# Patient Record
Sex: Male | Born: 1995 | Race: White | Hispanic: No | Marital: Single | Smoking: Never smoker
Health system: Southern US, Community
[De-identification: ages and names within clinical notes are randomized; demographics above are authoritative.]

## PROBLEM LIST (undated history)

## (undated) ENCOUNTER — Emergency Department (HOSPITAL_COMMUNITY): Discharge status: Absent without leave | Payer: Self-pay

## (undated) DIAGNOSIS — F209 Schizophrenia, unspecified: Secondary | ICD-10-CM

## (undated) DIAGNOSIS — F329 Major depressive disorder, single episode, unspecified: Secondary | ICD-10-CM

## (undated) DIAGNOSIS — F32A Depression, unspecified: Secondary | ICD-10-CM

## (undated) DIAGNOSIS — F849 Pervasive developmental disorder, unspecified: Secondary | ICD-10-CM

## (undated) HISTORY — PX: ANTERIOR CRUCIATE LIGAMENT REPAIR: SHX115

---

## 2015-02-19 ENCOUNTER — Emergency Department (HOSPITAL_COMMUNITY)
Admission: EM | Admit: 2015-02-19 | Discharge: 2015-02-19 | Disposition: A | Discharge status: Home / Self Care | Payer: Medicaid Other | Attending: Emergency Medicine | Admitting: Emergency Medicine

## 2015-02-19 ENCOUNTER — Encounter (HOSPITAL_COMMUNITY): Payer: Self-pay | Admitting: Emergency Medicine

## 2015-02-19 DIAGNOSIS — F209 Schizophrenia, unspecified: Secondary | ICD-10-CM | POA: Insufficient documentation

## 2015-02-19 DIAGNOSIS — F329 Major depressive disorder, single episode, unspecified: Secondary | ICD-10-CM | POA: Diagnosis not present

## 2015-02-19 DIAGNOSIS — Z760 Encounter for issue of repeat prescription: Secondary | ICD-10-CM | POA: Insufficient documentation

## 2015-02-19 HISTORY — DX: Depression, unspecified: F32.A

## 2015-02-19 HISTORY — DX: Schizophrenia, unspecified: F20.9

## 2015-02-19 HISTORY — DX: Pervasive developmental disorder, unspecified: F84.9

## 2015-02-19 HISTORY — DX: Major depressive disorder, single episode, unspecified: F32.9

## 2015-02-19 MED ORDER — FLUOXETINE HCL 20 MG PO CAPS
20.0000 mg | ORAL_CAPSULE | Freq: Every day | ORAL | Status: DC
  Administered 2015-02-19: 20 mg via ORAL
  Filled 2015-02-19: qty 1

## 2015-02-19 MED ORDER — FLUOXETINE HCL 20 MG PO TABS
20.0000 mg | ORAL_TABLET | Freq: Every day | ORAL | Status: DC
Start: 2015-02-19 — End: 2015-03-25

## 2015-02-19 NOTE — Discharge Instructions (Signed)
Medicine Refill at the Emergency Department  We have refilled your medicine today, but it is best for you to get refills through your primary health care provider's office. In the future, please plan ahead so you do not need to get refills from the emergency department.  If the medicine we refilled was a maintenance medicine, you may have received only enough to get you by until you are able to see your regular health care provider.     This information is not intended to replace advice given to you by your health care provider. Make sure you discuss any questions you have with your health care provider.     Document Released: 05/11/2003 Document Revised: 02/12/2014 Document Reviewed: 05/01/2013  Elsevier Interactive Patient Education 2016 Elsevier Inc.

## 2015-02-19 NOTE — ED Notes (Signed)
SEE PA assessment 

## 2015-02-19 NOTE — ED Notes (Signed)
Pt reports that he ran out of his Psych medication Prozac yesterday and was unable to see his psychiatrist to get a refill. Pt denies any psych complaints at this time.

## 2015-02-19 NOTE — ED Notes (Signed)
Declined W/C at D/C and was escorted to lobby by RN. 

## 2015-02-19 NOTE — ED Provider Notes (Signed)
CSN: [id number]     Arrival date & time 02/19/15  1720 History  By signing my name below, I, Emmanuella Mensah, attest that this documentation has been prepared under the direction and in the presence of Cheri FowlerKayla Jace Dowe, PA-C. Electronically Signed: Angelene GiovanniEmmanuella Mensah, ED Scribe. 02/19/2015. 5:51 PM.    Chief Complaint  Patient presents with  . Medication Refill   The history is provided by the patient. No language interpreter was used.   HPI Comments: Brett Pilgriman Meza is a 20 y.o. male who presents to the Emergency Department for his psych medication refill. He reports that he is currently on 20 mg of Prozac once a day for depression and he ran out of his medication yesterday.  He explains that he is here because he [facility], but they are closed for 3 days (reopen Tuesday).  He denies any current SI/HI, pain or any other complaints.    Past Medical History  Diagnosis Date  . Depression   . Schizophrenia, childhood    Past Surgical History  Procedure Laterality Date  . Anterior cruciate ligament repair     No family history on file. Social History  Substance Use Topics  . Smoking status: Never Smoker   . Smokeless tobacco: None  . Alcohol Use: No    Review of Systems  Musculoskeletal: Negative for myalgias.  Psychiatric/Behavioral: Negative for suicidal ideas and self-injury.  All other systems reviewed and are negative.     Allergies  Review of patient's allergies indicates no known allergies.  Home Medications   Prior to Admission medications   Medication Sig Start Date End Date Taking? Authorizing Provider  FLUoxetine (PROZAC) 20 MG tablet Take 1 tablet (20 mg total) by mouth daily. 02/19/15   Jayin Derousse, PA-C   BP 124/66 mmHg  Pulse 76  Temp(Src) 98.3 F (36.8 C) (Oral)  Resp 20  Wt 65.772 kg  SpO2 98% Physical Exam  Constitutional: He is oriented to person, place, and time. He appears well-developed and well-nourished.  HENT:   Head: Normocephalic and atraumatic.  Mouth/Throat: Oropharynx is clear and moist.  Eyes: Conjunctivae are normal.  Neck: Normal range of motion. Neck supple.  Cardiovascular: Normal rate, regular rhythm and normal heart sounds.   No murmur heard. Pulmonary/Chest: Effort normal and breath sounds normal. No accessory muscle usage or stridor. No respiratory distress. He has no wheezes. He has no rhonchi. He has no rales.  Abdominal: He exhibits no distension.  Musculoskeletal: Normal range of motion.  Lymphadenopathy:    He has no cervical adenopathy.  Neurological: He is alert and oriented to person, place, and time.  Speech clear without dysarthria.  Skin: Skin is warm and dry.  Psychiatric: He has a normal mood and affect. His behavior is normal.  Nursing note and vitals reviewed.   ED Course  Procedures (including critical care time) DIAGNOSTIC STUDIES: Oxygen Saturation is 98% on RA, normal by my interpretation.    COORDINATION OF CARE: 5:48 PM- Pt advised of plan for treatment and pt agrees. Pt will receive a dose of 20 mg Prozac here and a prescription to fill until [facility] opens.   MDM  Pt here for refill of medication. Medication is not a controlled substance. Will refill medication here. Discussed need to follow up with PCP in 2-3 days.  Pt is safe for discharge at this time.  Final diagnoses:  Medication refill    I personally performed the services described in this documentation, which was  scribed in my presence. The recorded information has been reviewed and is accurate.   Cheri Fowler, PA-C 02/19/15 1753  Gerhard Munch, MD 02/19/15 [id number]

## 2015-03-21 ENCOUNTER — Emergency Department (HOSPITAL_COMMUNITY)
Admission: EM | Admit: 2015-03-21 | Discharge: 2015-03-22 | Disposition: A | Discharge status: Other Acute Inpatient Hospital | Payer: MEDICAID | Attending: Emergency Medicine | Admitting: Emergency Medicine

## 2015-03-21 ENCOUNTER — Encounter (HOSPITAL_COMMUNITY): Payer: Self-pay | Admitting: *Deleted

## 2015-03-21 DIAGNOSIS — F332 Major depressive disorder, recurrent severe without psychotic features: Secondary | ICD-10-CM | POA: Insufficient documentation

## 2015-03-21 DIAGNOSIS — Z79899 Other long term (current) drug therapy: Secondary | ICD-10-CM | POA: Insufficient documentation

## 2015-03-21 DIAGNOSIS — F209 Schizophrenia, unspecified: Secondary | ICD-10-CM | POA: Insufficient documentation

## 2015-03-21 DIAGNOSIS — R45851 Suicidal ideations: Secondary | ICD-10-CM

## 2015-03-21 LAB — URINALYSIS, ROUTINE W REFLEX MICROSCOPIC
Bilirubin Urine: NEGATIVE
Glucose, UA: NEGATIVE mg/dL
HGB URINE DIPSTICK: NEGATIVE
Ketones, ur: NEGATIVE mg/dL
Leukocytes, UA: NEGATIVE
Nitrite: NEGATIVE
PROTEIN: NEGATIVE mg/dL
Specific Gravity, Urine: 1.024 (ref 1.005–1.030)
pH: 5.5 (ref 5.0–8.0)

## 2015-03-21 LAB — COMPREHENSIVE METABOLIC PANEL
ALBUMIN: 4.9 g/dL (ref 3.5–5.0)
ALT: 26 U/L (ref 17–63)
AST: 23 U/L (ref 15–41)
Alkaline Phosphatase: 60 U/L (ref 38–126)
Anion gap: 7 (ref 5–15)
BILIRUBIN TOTAL: 0.5 mg/dL (ref 0.3–1.2)
BUN: 15 mg/dL (ref 6–20)
CHLORIDE: 106 mmol/L (ref 101–111)
CO2: 25 mmol/L (ref 22–32)
Calcium: 9 mg/dL (ref 8.9–10.3)
Creatinine, Ser: 0.84 mg/dL (ref 0.61–1.24)
GFR calc Af Amer: >60 mL/min (ref >60)
GFR calc non Af Amer: >60 mL/min (ref >60)
GLUCOSE: 92 mg/dL (ref 65–99)
POTASSIUM: 3.4 mmol/L — AB (ref 3.5–5.1)
Sodium: 138 mmol/L (ref 135–145)
Total Protein: 6.9 g/dL (ref 6.5–8.1)

## 2015-03-21 LAB — SALICYLATE LEVEL: Salicylate Lvl: <4 mg/dL (ref 2.8–30.0)

## 2015-03-21 LAB — CBC
HEMATOCRIT: 38.6 % — AB (ref 39.0–52.0)
Hemoglobin: 13.2 g/dL (ref 13.0–17.0)
MCH: 29.9 pg (ref 26.0–34.0)
MCHC: 34.2 g/dL (ref 30.0–36.0)
MCV: 87.5 fL (ref 78.0–100.0)
Platelets: 180 10*3/uL (ref 150–400)
RBC: 4.41 MIL/uL (ref 4.22–5.81)
RDW: 12.8 % (ref 11.5–15.5)
WBC: 5.8 10*3/uL (ref 4.0–10.5)

## 2015-03-21 LAB — RAPID URINE DRUG SCREEN, HOSP PERFORMED
AMPHETAMINES: NOT DETECTED
BARBITURATES: NOT DETECTED
BENZODIAZEPINES: NOT DETECTED
Cocaine: NOT DETECTED
Opiates: NOT DETECTED
TETRAHYDROCANNABINOL: NOT DETECTED

## 2015-03-21 LAB — ACETAMINOPHEN LEVEL: Acetaminophen (Tylenol), Serum: <10 ug/mL — ABNORMAL LOW (ref 10–30)

## 2015-03-21 LAB — ETHANOL: Alcohol, Ethyl (B): <5 mg/dL (ref <5)

## 2015-03-21 MED ORDER — FLUOXETINE HCL 20 MG PO CAPS
20.0000 mg | ORAL_CAPSULE | Freq: Every day | ORAL | Status: DC
  Administered 2015-03-22: 20 mg via ORAL
  Filled 2015-03-21 (×2): qty 1

## 2015-03-21 MED ORDER — LORAZEPAM 1 MG PO TABS
1.0000 mg | ORAL_TABLET | Freq: Three times a day (TID) | ORAL | Status: DC | PRN
  Administered 2015-03-21: 1 mg via ORAL
  Filled 2015-03-21: qty 1

## 2015-03-21 NOTE — ED Notes (Signed)
Pt reports SI with plan to jump off a bridge.  Pt reports hx of depression.  Pt is voluntary.  Is calm and cooperative at this time.  Pt's therapist is at bedside.  Medical clearance process explained to pt and therapist.
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
## 2015-03-21 NOTE — ED Notes (Signed)
Patient appears pleasant, cooperative. Currently denies SI, HI, AVH. Rates feelings of anxiety 7/10, depression 5/10. Reports ACL tear 2 years ago, mild left knee discomfort at present.   Encouragement offered. Oriented to the unit. Meal provided. Puzzles given.  Q 15 safety checks in place.

## 2015-03-21 NOTE — BH Assessment (Signed)
Assessment completed. Consulted with Nanine Means, DNP who recommends inpatient treatment at this time.  Davina Poke, LCSW Therapeutic Triage Specialist [facility] 03/21/2015 7:35 PM

## 2015-03-21 NOTE — ED Provider Notes (Signed)
CSN: [id number]     Arrival date & time 03/21/15  1446 History   First MD Initiated Contact with Patient 03/21/15 1612     Chief Complaint  Patient presents with  . Suicidal     (Consider location/radiation/quality/duration/timing/severity/associated sxs/prior Treatment) HPI   Patient is a 20 year old male with past medical history of depression who presents to the ED with complaint of suicide ideation. Patient reports he has been feeling depressed and having suicidal ideations since December which he attributes to "a beautiful girl". He notes he currently is being seen by a therapist and has an appointment scheduled for later this week. 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 Endorses having suicide ideation with plan of jumping off a bridge. Denies HI, visual or auditory hallucinations. Patient states that he is on Prozac and reports to taking it as prescribed. Denies alcohol use, drug use. Patient denies any pain or complaints at this time.  Past Medical History  Diagnosis Date  . Depression   . Schizophrenia, childhood    Past Surgical History  Procedure Laterality Date  . Anterior cruciate ligament repair     No family history on file. Social History  Substance Use Topics  . Smoking status: Never Smoker   . Smokeless tobacco: None  . Alcohol Use: No    Review of Systems  Psychiatric/Behavioral: Positive for suicidal ideas.  All other systems reviewed and are negative.     Allergies  Review of patient's allergies indicates no known allergies.  Home Medications   Prior to Admission medications   Medication Sig Start Date End Date Taking? Authorizing Provider  FLUoxetine (PROZAC) 20 MG tablet Take 1 tablet (20 mg total) by mouth daily. 02/19/15  Yes Kayla Rose, PA-C   BP 120/61 mmHg  Pulse 62  Temp(Src) 98.1 F (36.7 C) (Oral)  Resp 18  SpO2 100% Physical Exam   Constitutional: He is oriented to person, place, and time. He appears well-developed and well-nourished.  HENT:  Head: Normocephalic and atraumatic.  Mouth/Throat: Oropharynx is clear and moist. No oropharyngeal exudate.  Eyes: Conjunctivae and EOM are normal. Pupils are equal, round, and reactive to light. Right eye exhibits no discharge. Left eye exhibits no discharge. No scleral icterus.  Neck: Normal range of motion. Neck supple.  Cardiovascular: Normal rate, regular rhythm, normal heart sounds and intact distal pulses.   Pulmonary/Chest: Effort normal and breath sounds normal. No respiratory distress. He has no wheezes. He has no rales. He exhibits no tenderness.  Abdominal: Soft. Bowel sounds are normal. He exhibits no distension and no mass. There is no tenderness. There is no rebound and no guarding.  Musculoskeletal: Normal range of motion. He exhibits no edema.  Lymphadenopathy:    He has no cervical adenopathy.  Neurological: He is alert and oriented to person, place, and time.  Skin: Skin is warm and dry.  Psychiatric: He has a normal mood and affect. His speech is normal and behavior is normal. Cognition and memory are normal. He expresses suicidal ideation. He expresses suicidal plans.  Nursing note and vitals reviewed.   ED Course  Procedures (including critical care time) Labs Review Labs Reviewed  COMPREHENSIVE METABOLIC PANEL - Abnormal; Notable for the following:    Potassium 3.4 (*)    All other components within normal limits  ACETAMINOPHEN LEVEL - Abnormal; Notable for the following:    Acetaminophen (Tylenol), Serum <10 (*)  All other components within normal limits  CBC - Abnormal; Notable for the following:    HCT 38.6 (*)    All other components within normal limits  ETHANOL  SALICYLATE LEVEL  URINE RAPID DRUG SCREEN, HOSP PERFORMED  URINALYSIS, ROUTINE W REFLEX MICROSCOPIC (NOT AT [facility])    Imaging Review No results found. I have personally  reviewed and evaluated these images and lab results as part of my medical decision-making.  Filed Vitals:   03/21/15 1538  BP: 120/61  Pulse: 62  Temp: 98.1 F (36.7 C)  Resp: 18     MDM   Final diagnoses:  Suicidal ideation    Patient presents with suicidal ideation with plan. Hx of depression. Labs and urine unremarkable. Patient is medically cleared. Consulted TTS. Behavioral Health recommends inpatient treatment.    Satira Sark Aquilla, [place] 03/21/15 2010  Benjiman Core, MD 03/22/15 0001

## 2015-03-22 ENCOUNTER — Encounter (HOSPITAL_COMMUNITY): Payer: Self-pay | Admitting: *Deleted

## 2015-03-22 ENCOUNTER — Inpatient Hospital Stay (HOSPITAL_COMMUNITY)
Admission: AD | Admit: 2015-03-22 | Discharge: 2015-03-25 | DRG: 885 | Disposition: A | Discharge status: Home / Self Care | Payer: MEDICAID | Source: Intra-hospital | Attending: Psychiatry | Admitting: Psychiatry

## 2015-03-22 DIAGNOSIS — F329 Major depressive disorder, single episode, unspecified: Secondary | ICD-10-CM | POA: Diagnosis present

## 2015-03-22 DIAGNOSIS — Z87891 Personal history of nicotine dependence: Secondary | ICD-10-CM

## 2015-03-22 DIAGNOSIS — F332 Major depressive disorder, recurrent severe without psychotic features: Secondary | ICD-10-CM | POA: Diagnosis present

## 2015-03-22 DIAGNOSIS — R45851 Suicidal ideations: Secondary | ICD-10-CM | POA: Diagnosis present

## 2015-03-22 MED ORDER — FLUOXETINE HCL 20 MG PO CAPS
20.0000 mg | ORAL_CAPSULE | Freq: Every day | ORAL | Status: DC
  Administered 2015-03-23: 20 mg via ORAL
  Filled 2015-03-22 (×4): qty 1

## 2015-03-22 MED ORDER — MAGNESIUM HYDROXIDE 400 MG/5ML PO SUSP: 30.0000 mL | Freq: Every day | ORAL | Status: DC | PRN

## 2015-03-22 MED ORDER — LORAZEPAM 1 MG PO TABS
1.0000 mg | ORAL_TABLET | Freq: Three times a day (TID) | ORAL | Status: DC | PRN
  Administered 2015-03-22 – 2015-03-23 (×2): 1 mg via ORAL
  Filled 2015-03-22 (×2): qty 1

## 2015-03-22 MED ORDER — ALUM & MAG HYDROXIDE-SIMETH 200-200-20 MG/5ML PO SUSP: 30.0000 mL | ORAL | Status: DC | PRN

## 2015-03-22 MED ORDER — ACETAMINOPHEN 325 MG PO TABS: 650.0000 mg | ORAL_TABLET | Freq: Four times a day (QID) | ORAL | Status: DC | PRN

## 2015-03-22 NOTE — BH Assessment (Signed)
[facility] Assessment Progress Note  Per Thedore Mins, MD, this pt requires psychiatric hospitalization at this time.  Minerva Areola , RN, [facility] has assigned pt to [facility] Rm 405-2 with a caveat that pt is not to be transferred until after 15:00.  Shean, TS agrees to have pt sign Voluntary Admission and Consent for Treatment and Consent to Release Information.  Pt's nurse, Rudean Hitt, has been notified, and agrees to send original paperwork along with pt via Juel Burrow, and to call report to [phone].  Doylene Canning, MA Triage Specialist [phone]

## 2015-03-22 NOTE — Progress Notes (Signed)
Entered in d/c instructions [facility] Schedule an appointment as soon as possible for a visit As needed This is your assigned Medicaid [place] access doctor If you prefer another contact DSS [phone] DSS assigned your doctor *You may receive a bill if you go to any family Dr not assigned to you [facility] [address] [place], [place] [address] [phone] [facility] Department of Social Services Call As needed to change your doctor name as needed to an adult pcp versus a pediatric pcp You are older than 18 therefore no longer a pediatric patient [address] [place], [place] [address] [phone]

## 2015-03-22 NOTE — ED Notes (Signed)
Patient has been pleasant and cooperative today.  Continues to endorse suicidal ideation and agrees to a hospitalization for medication stabilization.  His appetite is fair.  He is attending to hygiene.  Will be transferred later today to [facility].

## 2015-03-22 NOTE — Tx Team (Signed)
Initial Interdisciplinary Treatment Plan   PATIENT STRESSORS: Traumatic event   PATIENT STRENGTHS: Capable of independent living Communication skills Physical Health Supportive family/friends   PROBLEM LIST: Problem List/Patient Goals Date to be addressed Date deferred Reason deferred Estimated date of resolution  Depression 03/22/2015     Anxiety 03/22/2015     Conflict w/girlfriend 03/22/2015     Overwhelmed with school 03/22/2015                                    DISCHARGE CRITERIA:  Motivation to continue treatment in a less acute level of care Need for constant or close observation no longer present Verbal commitment to aftercare and medication compliance  PRELIMINARY DISCHARGE PLAN: Outpatient therapy Return to previous living arrangement Return to previous work or school arrangements  PATIENT/FAMIILY INVOLVEMENT: This treatment plan has been presented to and reviewed with the patient, Brett Meza.  The patient and family have been given the opportunity to ask questions and make suggestions.  Cranford Mon 03/22/2015, 7:02 PM
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
## 2015-03-22 NOTE — Progress Notes (Signed)
Adult Psychoeducational Group Note  Date:  03/22/2015 Time:  10:46 PM  Group Topic/Focus:  Wrap-Up Group:   The focus of this group is to help patients review their daily goal of treatment and discuss progress on daily workbooks.  Participation Level:  Active  Participation Quality:  Appropriate  Affect:  Appropriate  Cognitive:  Alert  Insight: Appropriate  Engagement in Group:  Engaged  Modes of Intervention:  Discussion  Additional Comments:  Pt stated that his goal is to start feeling better about himself. His goal for tomorrow is to make friends.   Kaleen Odea R 03/22/2015, 10:46 PM

## 2015-03-22 NOTE — Progress Notes (Signed)
Patient ID: Brett Meza, male   DOB: Apr 27, 1995, 20 y.o.   MRN: [id number] D: Patient in dayroom playing with puzzles. Pt reports increase anxiety, rated as 9 on a 0-10 scale. Pt denies depression. Pt reports music as a coping skill. Pt denies SI/HI/AVH and pain. Pt attended evening wrap up group and Interacted appropriately with peers. Pt denies any needs or concerns.  Cooperative with assessment.  A: Met with pt 1:1. Medications administered as prescribed. Writer encouraged pt to discuss feelings. Pt encouraged to come to staff with any question or concerns.  R: Patient remains safe and complaint with medications.

## 2015-03-23 ENCOUNTER — Encounter (HOSPITAL_COMMUNITY): Payer: Self-pay | Admitting: Psychiatry

## 2015-03-23 DIAGNOSIS — F332 Major depressive disorder, recurrent severe without psychotic features: Principal | ICD-10-CM

## 2015-03-23 MED ORDER — CITALOPRAM HYDROBROMIDE 10 MG PO TABS
10.0000 mg | ORAL_TABLET | Freq: Every day | ORAL | Status: DC
  Administered 2015-03-24 – 2015-03-25 (×2): 10 mg via ORAL
  Filled 2015-03-23 (×4): qty 1

## 2015-03-23 MED ORDER — LORAZEPAM 0.5 MG PO TABS
0.5000 mg | ORAL_TABLET | Freq: Three times a day (TID) | ORAL | Status: DC | PRN
  Administered 2015-03-23: 0.5 mg via ORAL
  Filled 2015-03-23: qty 1

## 2015-03-23 MED ORDER — TRAZODONE HCL 50 MG PO TABS
50.0000 mg | ORAL_TABLET | Freq: Every evening | ORAL | Status: DC | PRN
  Administered 2015-03-23 – 2015-03-24 (×2): 50 mg via ORAL
  Filled 2015-03-23 (×2): qty 1

## 2015-03-23 NOTE — [facility] Suicide Risk Assessment (Signed)
[facility] INPATIENT:  Family/Significant Other Suicide Prevention Education  Suicide Prevention Education:  Education Completed; Shirlee More, Pt's mother [phone], has been identified by the patient as the family member/significant other with whom the patient will be residing, and identified as the person(s) who will aid the patient in the event of a mental health crisis (suicidal ideations/suicide attempt).  With written consent from the patient, the family member/significant other has been provided the following suicide prevention education, prior to the and/or following the discharge of the patient.  The suicide prevention education provided includes the following:  Suicide risk factors  Suicide prevention and interventions  National Suicide Hotline telephone number  [facility] assessment telephone number  [facility] Emergency Assistance 911  [facility] and/or Residential Mobile Crisis Unit telephone number  Request made of family/significant other to:  Remove weapons (e.g., guns, rifles, knives), all items previously/currently identified as safety concern.    Remove drugs/medications (over-the-counter, prescriptions, illicit drugs), all items previously/currently identified as a safety concern.  The family member/significant other verbalizes understanding of the suicide prevention education information provided.  The family member/significant other agrees to remove the items of safety concern listed above.  Elaina Hoops 03/23/2015, 1:12 PM

## 2015-03-23 NOTE — [facility] Suicide Risk Assessment (Signed)
[facility] Admission Suicide Risk Assessment   Nursing information obtained from:   patient and chart  Demographic factors:   20 year old single male  Current Mental Status:   see below  Loss Factors:   some relationship stress  Historical Factors:   long history of anxiety Risk Reduction Factors:   in college, supportive family  Total Time spent with patient: 45 minutes Principal Problem:  Major Depression, Recurrent, No psychotic features  Diagnosis:   Patient Active Problem List   Diagnosis Date Noted  . Major depressive disorder, recurrent, severe without psychotic features (HCC) [F33.2] 03/22/2015     Continued Clinical Symptoms:  Alcohol Use Disorder Identification Test Final Score (AUDIT): 1 The "Alcohol Use Disorders Identification Test", Guidelines for Use in Primary Care, Second Edition.  World Science writer [facility]). Score between 0-7:  no or low risk or alcohol related problems. Score between 8-15:  moderate risk of alcohol related problems. Score between 16-19:  high risk of alcohol related problems. Score 20 or above:  warrants further diagnostic evaluation for alcohol dependence and treatment.   CLINICAL FACTORS:  20 year old single male, history of anxiety, mainly described as panic and social anxiety, history of worsening depression over recent weeks, with emergence of suicidal ideations .     Psychiatric Specialty Exam: ROS  Blood pressure 108/77, pulse 91, temperature 98.2 F (36.8 C), temperature source Oral, resp. rate 16, height  (1.803 m), weight 145 lb (65.772 kg), SpO2 100 %.Body mass index is 20.23 kg/(m^2).   see admit note MSE  COGNITIVE FEATURES THAT CONTRIBUTE TO RISK:  Loss of executive function    SUICIDE RISK:   Moderate:  Frequent suicidal ideation with limited intensity, and duration, some specificity in terms of plans, no associated intent, good self-control, limited dysphoria/symptomatology, some risk factors present, and identifiable  protective factors, including available and accessible social support.  PLAN OF CARE: Patient will be admitted to inpatient psychiatric unit for stabilization and safety. Will provide and encourage milieu participation. Provide medication management and maked adjustments as needed.  Will follow daily.    I certify that inpatient services furnished can reasonably be expected to improve the patient's condition.   Nehemiah Massed, MD 03/23/2015, 5:00 PM
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
## 2015-03-23 NOTE — Progress Notes (Signed)
Adult Psychoeducational Group Note  Date:  03/23/2015 Time:  9:36 PM  Group Topic/Focus:  Wrap-Up Group:   The focus of this group is to help patients review their daily goal of treatment and discuss progress on daily workbooks.  Participation Level:  Active  Participation Quality:  Appropriate  Affect:  Appropriate  Cognitive:  Alert  Insight: Appropriate  Engagement in Group:  Engaged  Modes of Intervention:  Discussion  Additional Comments:  Pt stated that today was better than yesterday. His goal for tomorrow is to finish up a book that he has been reading. One positive coping skill that he has is to be mindful.   Flonnie Hailstone 03/23/2015, 9:36 PM

## 2015-03-23 NOTE — [facility] Group Notes (Signed)
[facility] LCSW Group Therapy 03/23/2015 1:15 PM  Type of Therapy: Group Therapy- Emotion Regulation  Participation Level: Active   Participation Quality:  Appropriate  Affect: Appropriate  Cognitive: Alert and Oriented   Insight:  Developing/Improving  Engagement in Therapy: Developing/Improving and Engaged   Modes of Intervention: Clarification, Confrontation, Discussion, Education, Exploration, Limit-setting, Orientation, Problem-solving, Rapport Building, Dance movement psychotherapist, Socialization and Support  Summary of Progress/Problems: The topic for group today was emotional regulation. This group focused on both positive and negative emotion identification and allowed group members to process ways to identify feelings, regulate negative emotions, and find healthy ways to manage internal/external emotions. Group members were asked to reflect on a time when their reaction to an emotion led to a negative outcome and explored how alternative responses using emotion regulation would have benefited them. Group members were also asked to discuss a time when emotion regulation was utilized when a negative emotion was experienced. Pt shared that anger was difficult to regulate as well as sadness. He was able to describe the importance of "making yourself" engage in activities that one enjoys, regardless of motivation, because it can help mood. Pt also discussed practices in mindfulness and how they have been helpful in managing anxiety.   Chad Cordial, LCSWA 03/23/2015 4:21 PM

## 2015-03-23 NOTE — Progress Notes (Signed)
Recreation Therapy Notes  Date: 02.17.2017 Time: 9:30am Location: [address] Group Room   Group Topic: Stress Management  Goal Area(s) Addresses:  Patient will actively participate in stress management techniques presented during session.   Behavioral Response: Appropriate, Engaged   Intervention: Stress management techniques  Activity :  Deep Breathing and Guided Imagery. LRT provided instruction and demonstration on practice of Guided Imagery. Technique was coupled with deep breathing.   Education:  Stress Management, Discharge Planning.   Education Outcome: Acknowledges education  Clinical Observations/Feedback: Patient actively engaged in technique introduced, expressed no concerns and demonstrated ability to practice independently post d/c.   Lavalle Skoda L Kaymen Adrian, LRT/CTRS        Mayline Dragon L 03/23/2015 2:05 PM 

## 2015-03-23 NOTE — B[facility] Group Notes (Signed)
[place] LCSW Aftercare Discharge Planning Group Note  03/23/2015 8:45 AM  Participation Quality: Alert, Appropriate and Oriented  Mood/Affect: Appropriate  Depression Rating: 4-5  Anxiety Rating: 4-5  Thoughts of Suicide: Pt denies SI/HI  Will you contract for safety? Yes  Current AVH: Pt denies  Plan for Discharge/Comments: Pt attended discharge planning group and actively participated in group. CSW discussed suicide prevention education with the group and encouraged them to discuss discharge planning and any relevant barriers. Pt shared that his mood was improving and that he was in the hospital to get help with depression.  Transportation Means: Pt reports access to transportation  Supports: No supports mentioned at this time  Chad Cordial, LCSWA 03/23/2015 9:46 AM

## 2015-03-23 NOTE — Progress Notes (Signed)
Patient ID: Brett Meza, male   DOB: Dec 12, 1995, 20 y.o.   MRN: [id number]  DAR: Pt. Denies SI/HI and A/V Hallucinations. He does report intermittent anxiety and received PRN Ativan. He reports sleep is fair, appetite is good, energy level is normal, and concentration is good. He rates depression and hopelessness 4/10 and anxiety 6/10. Patient does not report any pain at this time. Support and encouragement provided to the patient. Scheduled medications administered to patient per physician's orders. Patient is receptive and cooperative. He is seen in the milieu interacting with peers and playing piano. Q15 minute checks are maintained for safety.

## 2015-03-23 NOTE — Tx Team (Signed)
Interdisciplinary Treatment Plan Update (Adult) Date: 03/23/2015   Date: 03/23/2015 11:23 AM  Progress in Treatment:  Attending groups: Yes  Participating in groups: Yes  Taking medication as prescribed: Yes  Tolerating medication: Yes  Family/Significant othe contact made: No, CSW attempting to make contact with mother Patient understands diagnosis: Yes AEB seeking help with depression Discussing patient identified problems/goals with staff: Yes  Medical problems stabilized or resolved: Yes  Denies suicidal/homicidal ideation: Yes Patient has not harmed self or Others: Yes   New problem(s) identified: None identified at this time.   Discharge Plan or Barriers: Pt will return to his on-campus housing and follow-up with [facility] for medications and Sharlyn Bologna for therapy.  Additional comments:  Patient and CSW reviewed pt's identified goals and treatment plan. Patient verbalized understanding and agreed to treatment plan. CSW reviewed [facility] "Discharge Process and Patient Involvement" Form. Pt verbalized understanding of information provided and signed form.   Reason for Continuation of Hospitalization:  Anxiety Depression Medication stabilization Suicidal ideation  Estimated length of stay: 3-5 days  Review of initial/current patient goals per problem list:   1.  Goal(s): Patient will participate in aftercare plan  Met:  Yes  Target date: 3-5 days from date of admission   As evidenced by: Patient will participate within aftercare plan AEB aftercare provider and housing plan at discharge being identified.   03/23/15:  Pt will return to his on-campus housing and follow-up with [facility] for medications and Sharlyn Bologna for therapy.  2.  Goal (s): Patient will exhibit decreased depressive symptoms and suicidal ideations.  Met:  Progressing  Target date: 3-5 days from date of admission   As evidenced by: Patient will utilize self rating of depression at 3 or below and  demonstrate decreased signs of depression or be deemed stable for discharge by MD.  03/23/15: Pt rates depression at 5/10; denies SI  3.  Goal(s): Patient will demonstrate decreased signs and symptoms of anxiety.  Met:  Progressing  Target date: 3-5 days from date of admission   As evidenced by: Patient will utilize self rating of anxiety at 3 or below and demonstrated decreased signs of anxiety, or be deemed stable for discharge by MD  03/23/15: Pt rates anxiety at 5/10  Attendees:  Patient:    Family:    Physician: Dr. Parke Poisson, MD  03/23/2015 11:23 AM  Nursing: Lars Pinks, RN Case manager  03/23/2015 11:23 AM  Clinical Social Worker Peri Maris, [place] 03/23/2015 11:23 AM  Other: Erasmo Downer Drinkard, [place] 03/23/2015 11:23 AM  Clinical: Gaylan Gerold, RN; Marcella Dubs, RN 03/23/2015 11:23 AM  Other: , RN Charge Nurse 03/23/2015 11:23 AM  Other: Hilda Lias, [place], [place] Work [phone]

## 2015-03-23 NOTE — H&P (Signed)
Psychiatric Admission Assessment Adult  Patient Identification: Brett Meza MRN:  [id number] Date of Evaluation:  03/23/2015 Chief Complaint:   " depression, anxiety" Principal Diagnosis: Suicidal Ideations  Diagnosis:   Patient Active Problem List   Diagnosis Date Noted  . Major depressive disorder, recurrent, severe without psychotic features ([place]) [F33.2] 03/22/2015   History of Present Illness::  20 year old single male . Reports that over the last two months has been having suicidal ideations . States these ideations come when " I am really anxious or when I get angry at myself " . Cannot identify any specific trigger for his depression, but states relationship stress has contributed .  Recently  told one of his college teachers that he was having suicidal ideations , and was brought to the hospital.  Patient denies any actual plan or intention of suicide and describes his suicidal ideations as passive . Patient describes a long history of anxiety , dating back to high school. Describes excessive worrying and increasingly frequent panic attacks , mainly related to social interactions .  At  patient's request  and in his presence I spoke with his mother via phone who corroborated long history of anxiety, and recently worsening anxiety and depression. She feels Prozac , which he has been taking for several weeks , is not helping .  Associated Signs/Symptoms: Depression Symptoms:  depressed mood, anhedonia, suicidal thoughts without plan, anxiety, panic attacks, loss of energy/fatigue, decreased sense of self esteem (Hypo) Manic Symptoms:   Does not endorse  Anxiety Symptoms:   Describes panic attacks and excessive worrying  Psychotic Symptoms:  Occasionally hears name being called, but states it has not happened over recent days  PTSD Symptoms: Denies  Total Time spent with patient: 45 minutes  Past Psychiatric History:  One prior psychiatric admission at age 52 for " hearing voices  ". No history of suicide attempts, no history of self cutting, denies history of violence, as noted, describes occasional auditory hallucinations, mainly hearing name being called  - states this has not happened in several months. Denies history of mania, describes history of previous episodes of depression. Describes history of anxiety, and history of panic attacks, often triggered by social situations . Denies PTSD symptoms .  Denies OCD symptoms .  Has an outpatient psychiatrist , Dr. Reyne Dumas. Has an outpatient therapist, Concha Pyo  Is the patient at risk to self? Yes.    Has the patient been a risk to self in the past 6 months? Yes.    Has the patient been a risk to self within the distant past? No.  Is the patient a risk to others? No.  Has the patient been a risk to others in the past 6 months? No.  Has the patient been a risk to others within the distant past? No.   Prior Inpatient Therapy:   Prior Outpatient Therapy:    Alcohol Screening: 1. How often do you have a drink containing alcohol?: Monthly or less 2. How many drinks containing alcohol do you have on a typical day when you are drinking?: 1 or 2 3. How often do you have six or more drinks on one occasion?: Never Preliminary Score: 0 9. Have you or someone else been injured as a result of your drinking?: No 10. Has a relative or friend or a doctor or another health worker been concerned about your drinking or suggested you cut down?: No Alcohol Use Disorder Identification Test Final Score (AUDIT): 1 Brief Intervention: AUDIT score  less than 7 or less-screening does not suggest unhealthy drinking-brief intervention not indicated Substance Abuse History in the last 12 months:   Denies alcohol abuse, denies any drug abuse, except for cannabis, which he states he stopped smoking 5-6 months ago. Consequences of Substance Abuse: denies Previous Psychotropic Medications:  States he has been on Prozac since December. States he  does not feel it is helping .  Psychological Evaluations: No  Past Medical History:  Past Medical History  Diagnosis Date  . Depression   . Schizophrenia, childhood     Past Surgical History  Procedure Laterality Date  . Anterior cruciate ligament repair     Family History:  Mother alive , states he has not seen father since he was 89 years old, patient has no contact with father . Has an older brother  Family Psychiatric  History:  Mother has history of depression and anxiety. 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, has a girlfriend , denies legal issues  History  Alcohol Use No     History  Drug Use No    Additional Social History: Marital status: Single (has been dating his girlfriend since April of last year) Are you sexually active?: No What is your sexual orientation?: heterosexual Does patient have children?: No  Allergies:  No Known Allergies Lab Results:  Results for orders placed or performed during the hospital encounter of 03/21/15 (from the past 48 hour(s))  Comprehensive metabolic panel     Status: Abnormal   Collection Time: 03/21/15  5:18 PM  Result Value Ref Range   Sodium 138 135 - 145 mmol/L   Potassium 3.4 (L) 3.5 - 5.1 mmol/L   Chloride 106 101 - 111 mmol/L   CO2 25 22 - 32 mmol/L   Glucose, Bld 92 65 - 99 mg/dL   BUN 15 6 - 20 mg/dL   Creatinine, Ser 0.84 0.61 - 1.24 mg/dL   Calcium 9.0 8.9 - 10.3 mg/dL   Total Protein 6.9 6.5 - 8.1 g/dL   Albumin 4.9 3.5 - 5.0 g/dL   AST 23 15 - 41 U/L   ALT 26 17 - 63 U/L   Alkaline Phosphatase 60 38 - 126 U/L   Total Bilirubin 0.5 0.3 - 1.2 mg/dL   GFR calc non Af Amer >60 >60 mL/min   GFR calc Af Amer >60 >60 mL/min    Comment: (NOTE) The eGFR has been calculated using the CKD EPI equation. This calculation has  not been validated in all clinical situations. eGFR's persistently <60 mL/min signify possible Chronic Kidney Disease.    Anion gap 7 5 - 15  Ethanol (ETOH)     Status: None   Collection Time: 03/21/15  5:18 PM  Result Value Ref Range   Alcohol, Ethyl (B) <5 <5 mg/dL    Comment:        LOWEST DETECTABLE LIMIT FOR SERUM ALCOHOL IS 5 mg/dL FOR MEDICAL PURPOSES ONLY   Salicylate level     Status: None   Collection Time: 03/21/15  5:18 PM  Result Value Ref Range   Salicylate Lvl <0.3 2.8 - 30.0 mg/dL  Acetaminophen level     Status: Abnormal   Collection Time: 03/21/15  5:18 PM  Result Value Ref Range   Acetaminophen (Tylenol), Serum <10 (L) 10 - 30  ug/mL    Comment:        THERAPEUTIC CONCENTRATIONS VARY SIGNIFICANTLY. A RANGE OF 10-30 ug/mL MAY BE AN EFFECTIVE CONCENTRATION FOR MANY PATIENTS. HOWEVER, SOME ARE BEST TREATED AT CONCENTRATIONS OUTSIDE THIS RANGE. ACETAMINOPHEN CONCENTRATIONS >150 ug/mL AT 4 HOURS AFTER INGESTION AND >50 ug/mL AT 12 HOURS AFTER INGESTION ARE OFTEN ASSOCIATED WITH TOXIC REACTIONS.   CBC     Status: Abnormal   Collection Time: 03/21/15  5:18 PM  Result Value Ref Range   WBC 5.8 4.0 - 10.5 K/uL   RBC 4.41 4.22 - 5.81 MIL/uL   Hemoglobin 13.2 13.0 - 17.0 g/dL   HCT 38.6 (L) 39.0 - 52.0 %   MCV 87.5 78.0 - 100.0 fL   MCH 29.9 26.0 - 34.0 pg   MCHC 34.2 30.0 - 36.0 g/dL   RDW 12.8 11.5 - 15.5 %   Platelets 180 150 - 400 K/uL  Urine rapid drug screen (hosp performed) (Not at [facility])     Status: None   Collection Time: 03/21/15  5:36 PM  Result Value Ref Range   Opiates NONE DETECTED NONE DETECTED   Cocaine NONE DETECTED NONE DETECTED   Benzodiazepines NONE DETECTED NONE DETECTED   Amphetamines NONE DETECTED NONE DETECTED   Tetrahydrocannabinol NONE DETECTED NONE DETECTED   Barbiturates NONE DETECTED NONE DETECTED    Comment:        DRUG SCREEN FOR MEDICAL PURPOSES ONLY.  IF CONFIRMATION IS NEEDED FOR ANY PURPOSE, NOTIFY LAB WITHIN 5  DAYS.        LOWEST DETECTABLE LIMITS FOR URINE DRUG SCREEN Drug Class       Cutoff (ng/mL) Amphetamine      1000 Barbiturate      200 Benzodiazepine   494 Tricyclics       496 Opiates          300 Cocaine          300 THC              50   Urinalysis, Routine w reflex microscopic     Status: None   Collection Time: 03/21/15  5:36 PM  Result Value Ref Range   Color, Urine YELLOW YELLOW   APPearance CLEAR CLEAR   Specific Gravity, Urine 1.024 1.005 - 1.030   pH 5.5 5.0 - 8.0   Glucose, UA NEGATIVE NEGATIVE mg/dL   Hgb urine dipstick NEGATIVE NEGATIVE   Bilirubin Urine NEGATIVE NEGATIVE   Ketones, ur NEGATIVE NEGATIVE mg/dL   Protein, ur NEGATIVE NEGATIVE mg/dL   Nitrite NEGATIVE NEGATIVE   Leukocytes, UA NEGATIVE NEGATIVE    Comment: MICROSCOPIC NOT DONE ON URINES WITH NEGATIVE PROTEIN, BLOOD, LEUKOCYTES, NITRITE, OR GLUCOSE <1000 mg/dL.    Blood Alcohol level:  Lab Results  Component Value Date   ETH <5 75/91/6384    Metabolic Disorder Labs:  No results found for: HGBA1C, MPG No results found for: PROLACTIN No results found for: CHOL, TRIG, HDL, CHOLHDL, VLDL, LDLCALC  Current Medications: Current Facility-Administered Medications  Medication Dose Route Frequency Provider Last Rate Last Dose  . acetaminophen (TYLENOL) tablet 650 mg  650 mg Oral Q6H PRN Delfin Gant, NP      . alum & mag hydroxide-simeth (MAALOX/MYLANTA) 200-200-20 MG/5ML suspension 30 mL  30 mL Oral Q4H PRN Delfin Gant, NP      . FLUoxetine (PROZAC) capsule 20 mg  20 mg Oral Daily Delfin Gant, NP   20 mg at 03/23/15 0741  . LORazepam (ATIVAN) tablet  1 mg  1 mg Oral Q8H PRN Delfin Gant, NP   1 mg at 03/23/15 1450  . magnesium hydroxide (MILK OF MAGNESIA) suspension 30 mL  30 mL Oral Daily PRN Delfin Gant, NP       PTA Medications: Prescriptions prior to admission  Medication Sig Dispense Refill Last Dose  . FLUoxetine (PROZAC) 20 MG tablet Take 1 tablet (20 mg  total) by mouth daily. 5 tablet 0 03/21/2015 at Unknown time    Musculoskeletal: Strength & Muscle Tone: within normal limits Gait & Station: normal Patient leans: N/A  Psychiatric Specialty Exam: Physical Exam  Review of Systems  Constitutional: Negative.   HENT: Negative.   Eyes: Negative.   Respiratory: Negative.   Cardiovascular: Negative.   Gastrointestinal: Negative.   Genitourinary: Negative.   Musculoskeletal: Negative.   Skin: Negative.   Neurological: Negative for seizures.  Endo/Heme/Allergies: Negative.   Psychiatric/Behavioral: Positive for depression and suicidal ideas. The patient is nervous/anxious.   All other systems reviewed and are negative.   Blood pressure 108/77, pulse 91, temperature 98.2 F (36.8 C), temperature source Oral, resp. rate 16, height 5' 11" (1.803 m), weight 145 lb (65.772 kg), SpO2 100 %.Body mass index is 20.23 kg/(m^2).  General Appearance: Well Groomed  Engineer, water::  Fair  Speech:  Normal Rate  Volume:  Normal  Mood:  Anxious and Depressed  Affect:  Constricted and anxious, but does smile at times appropriately   Thought Process:  Linear  Orientation:  Full (Time, Place, and Person)  Thought Content:  no hallucinations, no delusions   Suicidal Thoughts:  No denies any suicidal ideations at this time , no self injurious ideations   Homicidal Thoughts:  No denies any homicidal ideations   Memory:  recent and remote grossly intact   Judgement:  Fair  Insight:  Fair  Psychomotor Activity:  Normal  Concentration:  Good  Recall:  Good  Fund of Knowledge:Good  Language: Good  Akathisia:  No  Handed:  Right  AIMS (if indicated):     Assets:  Communication Skills Desire for Improvement Resilience  ADL's:  Intact  Cognition: WNL  Sleep:  Number of Hours: 6.75     Treatment Plan Summary: Daily contact with patient to assess and evaluate symptoms and progress in treatment, Medication management, Plan inpatient admission and  medications as below  Observation Level/Precautions:  15 minute checks  Laboratory:  check TSH  Psychotherapy:  Milieu, support   Medications:  We discussed options- agrees to D/C Prozac as it " is not helping", and may be temporally related to worsening symptoms . Agrees to CELEXA trial, start at 10 mgrs QDAY - agrees to ATIVAN 0.5 mgrs Q 8 hours PRN for anxiety   Consultations:  As needed   Discharge Concerns:  -   Estimated LOS: 5-6 days   Other:     I certify that inpatient services furnished can reasonably be expected to improve the patient's condition.    Neita Garnet, MD 2/15/20174:07 PM

## 2015-03-23 NOTE — Plan of Care (Signed)
Problem: Diagnosis: Increased Risk For Suicide Attempt Goal: STG-Patient Will Comply With Medication Regime Outcome: Progressing Pt complaint with medication regime     

## 2015-03-24 LAB — TSH: TSH: 4.118 u[IU]/mL (ref 0.350–4.500)

## 2015-03-24 NOTE — Plan of Care (Signed)
Problem: Diagnosis: Increased Risk For Suicide Attempt Goal: STG-Patient Will Attend All Groups On The Unit Outcome: Progressing Pt attended evening wrap up group     

## 2015-03-24 NOTE — BHH Group Notes (Signed)
[facility] Mental Health Association Group Therapy 03/24/2015 1:15pm  Type of Therapy: Mental Health Association Presentation  Participation Level: Active  Participation Quality: Attentive  Affect: Appropriate  Cognitive: Oriented  Insight: Developing/Improving  Engagement in Therapy: Engaged  Modes of Intervention: Discussion, Education and Socialization  Summary of Progress/Problems: Mental Health Association (MHA) Speaker came to talk about his personal journey with substance abuse and addiction. The pt processed ways by which to relate to the speaker. MHA speaker provided handouts and educational information pertaining to groups and services offered by the [facility]. Pt was engaged in speaker's presentation and was receptive to resources provided.    Chad Cordial, LCSWA 03/24/2015 1:45 PM

## 2015-03-24 NOTE — Progress Notes (Signed)
Patient ID: Brett Meza, male   DOB: February 28, 1995, 20 y.o.   MRN: [id number] D: Client visible on the unit, watching TV and on phone. Client reports anxiety "3" of 10,  "good day" Client notes goal was met today "not to panic as much" reports coping skills "music, meditation and talking to people" "tried not to isolate today, that helped" A:Writer provided emotional support, encouraged client to continue to use coping skills and reports to staff with any changes. Staff will monitor q64mn for safety. R: Client is safe on the unit,

## 2015-03-24 NOTE — Progress Notes (Signed)
Patient ID: Brett Meza, male   DOB: December 11, 1995, 21 y.o.   MRN: [id number] D: Patient upset about phone call with girlfriend. overheard pt stating "you don't understand , it's complicated". Pt has been calm most of the evening in dayroom coloring. Pt reports his day was good and tolerating medication well. Pt denies SI/HI/AVH and pain. Pt attended and engaged in evening wrap up group. Cooperative with assessment.  A: Met with pt 1:1. Medications administered as prescribed. Writer encouraged pt to discuss feelings. Pt encouraged to come to staff with any question or concerns.  R: Patient remains safe and complaint with medications.

## 2015-03-24 NOTE — Progress Notes (Signed)
Nutrition Education Note  Pt attended group focusing on general, healthful nutrition education.  RD emphasized the importance of eating regular meals and snacks throughout the day. Consuming sugar-free beverages and incorporating fruits and vegetables into diet when possible. Provided examples of healthy snacks. Patient encouraged to leave group with a goal to improve nutrition/healthy eating.   Diet Order:   Pt is also offered choice of unit snacks mid-morning and mid-afternoon.  Pt is eating as desired.   If additional nutrition issues arise, please consult RD.  Brett Mesenbrink, MS, RD, LDN Pager: [phone] After Hours Pager: [phone]    

## 2015-03-24 NOTE — Progress Notes (Signed)
Adult Psychoeducational Group Note  Date:  03/24/2015 Time:  0900  Group Topic/Focus:  Orientation:   The focus of this group is to educate the patient on the purpose and policies of crisis stabilization and provide a format to answer questions about their admission.  The group details unit policies and expectations of patients while admitted.  Participation Level:  Active  Participation Quality:  Appropriate  Affect:  Appropriate  Cognitive:  Appropriate  Insight: Appropriate  Engagement in Group:  Engaged  Modes of Intervention:  Education  Additional Comments:    Nivek Powley L 03/24/2015, 5:50 PM

## 2015-03-24 NOTE — Progress Notes (Signed)
Pt signed 72 hour request for discharge form on 03/24/15 at 1718.  Liborio Nixon, Charity fundraiser.

## 2015-03-24 NOTE — Progress Notes (Signed)
D: Pt presents with flat affect and anxious mood. Pt appears to be minimizing symptoms. Pt denies suicidal thoughts and verbally contracts for safety. Pt rates depression 2/10. Hopeless 1/10. Anxiety 4/10. Pt reported feeling anxious and concerned about wanting to discharge back to school today. Pt stated several times today that he have a school concert tomorrow that he would like to attend. Pt requested to speak to CSW in regards to notifying instructor of hospitalization. CSW made aware of pt request. A: medications reviewed with pt. Medications administered as ordered per MD. Verbal support provided. Pt encouraged to attend groups. 15  Minute checks performed for safety.  R: Pt compliant with tx.

## 2015-03-24 NOTE — Progress Notes (Addendum)
[facility] MD Progress Note  03/24/2015 4:13 PM Brett Meza  MRN:  [id number] Subjective:   Patient reports feeling better today, less anxious, and hoping for discharge soon. Denies medication side effects. Today spoke about social anxiety, and struggling with feelings of anxiety related to " trying to connect " with people. He does express a desire to be more sociable. He states he had  A good conversation with his GF yesterday, and this is also helping him feel better. Objective : I have discussed case with treatment team and have met with patient. Today presenting with a clearly improved mood and range of affect- smiles at times appropriately, better eye contact, and less overall anxiety. No medication side effects. Currently denies any suicidal ideations. No disruptive behaviors on the unit, has been going to some groups. 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 Labs - TSH WNL. Principal Problem: Major depressive disorder, recurrent, severe without psychotic features ([place]) Diagnosis:   Patient Active Problem List   Diagnosis Date Noted  . Major depressive disorder, recurrent, severe without psychotic features ([place]) [F33.2] 03/22/2015   Total Time spent with patient: 20 minutes    Past Medical History:  Past Medical History  Diagnosis Date  . Depression   . Schizophrenia, childhood     Past Surgical History  Procedure Laterality Date  . Anterior cruciate ligament repair     Family History: History reviewed. No pertinent family history.  Social History:  History  Alcohol Use No     History  Drug Use No    Social History   Social History  . Marital Status: Single    Spouse Name: N/A  . Number of Children: N/A  . Years of Education: N/A   Social History Main Topics  . Smoking status: Never Smoker   . Smokeless tobacco: None  . Alcohol Use: No  . Drug Use: No  . Sexual Activity: Not  Asked   Other Topics Concern  . None   Social History Narrative   Additional Social History:   Sleep: Good  Appetite:  Good  Current Medications: Current Facility-Administered Medications  Medication Dose Route Frequency Provider Last Rate Last Dose  . acetaminophen (TYLENOL) tablet 650 mg  650 mg Oral Q6H PRN Delfin Gant, NP      . alum & mag hydroxide-simeth (MAALOX/MYLANTA) 200-200-20 MG/5ML suspension 30 mL  30 mL Oral Q4H PRN Delfin Gant, NP      . citalopram (CELEXA) tablet 10 mg  10 mg Oral Daily Jenne Campus, MD   10 mg at 03/24/15 0830  . LORazepam (ATIVAN) tablet 0.5 mg  0.5 mg Oral Q8H PRN Jenne Campus, MD   0.5 mg at 03/23/15 2212  . magnesium hydroxide (MILK OF MAGNESIA) suspension 30 mL  30 mL Oral Daily PRN Delfin Gant, NP      . traZODone (DESYREL) tablet 50 mg  50 mg Oral QHS PRN Jenne Campus, MD   50 mg at 03/23/15 2212    Lab Results:  Results for orders placed or performed during the hospital encounter of 03/22/15 (from the past 48 hour(s))  TSH     Status: None   Collection Time: 03/24/15  6:22 AM  Result Value Ref Range   TSH 4.118 0.350 - 4.500 uIU/mL    Comment: Performed at [facility]    Blood Alcohol level:  Lab Results  Component Value Date   ETH <5 03/21/2015    Physical Findings: AIMS: Facial and Oral Movements Muscles of Facial Expression: None, normal Lips and Perioral Area: None, normal Jaw: None, normal Tongue: None, normal,Extremity Movements Upper (arms, wrists, hands, fingers): None, normal Lower (legs, knees, ankles, toes): None, normal, Trunk Movements Neck, shoulders, hips: None, normal, Overall Severity Severity of abnormal movements (highest score from questions above): None, normal Incapacitation due to abnormal movements: None, normal Patient's awareness of abnormal movements (rate only patient's report): No Awareness, Dental Status Current problems with teeth and/or  dentures?: No Does patient usually wear dentures?: No  CIWA:    COWS:     Musculoskeletal: Strength & Muscle Tone: within normal limits Gait & Station: normal Patient leans: N/A  Psychiatric Specialty Exam: ROS no chest pain, no SOB, no nausea, no vomiting   Blood pressure 99/71, pulse 76, temperature 98.2 F (36.8 C), temperature source Oral, resp. rate 16, height '5\' 11"'$  (1.803 m), weight 145 lb (65.772 kg), SpO2 100 %.Body mass index is 20.23 kg/(m^2).  General Appearance: improved grooming   Eye Contact::  improved  Speech:  Normal Rate  Volume:  Normal  Mood:  improving, less depressed   Affect:  more reactive, and less anxious   Thought Process:  Linear  Orientation:  Full (Time, Place, and Person)  Thought Content:  denies hallucinations, no delusions   Suicidal Thoughts:  No- at this time denies suicidal ideations and contracts for safety on the unit   Homicidal Thoughts:  No  Memory:  recent and remote grossly intact   Judgement:  Other:  improving   Insight:  improving   Psychomotor Activity:  Normal  Concentration:  Good  Recall:  Good  Fund of Knowledge:Good  Language: Good  Akathisia:  Negative  Handed:  Right  AIMS (if indicated):     Assets:  Communication Skills Desire for Improvement Physical Health Resilience Talents/Skills  ADL's:  Intact  Cognition: WNL  Sleep:  Number of Hours: 6.75  Assessment- patient is improved compared to initial presentation - today presenting with improved mood and range of affect, less severely anxious . No SI today. At this time tolerating Celexa well. As he improves he is focusing more on being discharged soon, and is future oriented, wanting to go back to college . Treatment Plan Summary: Daily contact with patient to assess and evaluate symptoms and progress in treatment, Medication management, Plan inpatient admission and medications as below Continue to encourage milieu, group participation in order to work on coping  skills and symptom reduction Continue Celexa 10 mgrs QDAY for depression and anxiety Continue Ativan 0.5 mgrs Q 8 hours PRN anxiety as needed  Continue Trazodone 50 mgrs QHS PRN for insomnia as needed  Treatment team working on disposition planning  Neita Garnet, MD 03/24/2015, 4:13 PM

## 2015-03-25 MED ORDER — CITALOPRAM HYDROBROMIDE 10 MG PO TABS: 10.0000 mg | ORAL_TABLET | Freq: Every day | ORAL | Status: DC

## 2015-03-25 MED ORDER — TRAZODONE HCL 50 MG PO TABS: 50.0000 mg | ORAL_TABLET | Freq: Every evening | ORAL | Status: DC | PRN

## 2015-03-25 NOTE — BHH Suicide Risk Assessment (Signed)
[facility] Discharge Suicide Risk Assessment   Principal Problem: Major depressive disorder, recurrent, severe without psychotic features [facility]) Discharge Diagnoses:  Patient Active Problem List   Diagnosis Date Noted  . Major depressive disorder, recurrent, severe without psychotic features (HCC) [F33.2] 03/22/2015    Total Time spent with patient: 30 minutes  Musculoskeletal: Strength & Muscle Tone: within normal limits Gait & Station: normal Patient leans: N/A  Psychiatric Specialty Exam: ROS  Blood pressure 116/69, pulse 61, temperature 97.8 F (36.6 C), temperature source Oral, resp. rate 16, height  (1.803 m), weight 145 lb (65.772 kg), SpO2 100 %.Body mass index is 20.23 kg/(m^2).  General Appearance: Well Groomed  Eye Contact::  Good  Speech:  Normal Rate409  Volume:  Normal  Mood:  denies depression, states "still anxious but better "  Affect:  Appropriate and more reactive, less anxious than on admission  Thought Process:  Linear  Orientation:  Full (Time, Place, and Person)  Thought Content:  denies hallucinations, no delusions, not internally preoccupied   Suicidal Thoughts:  No- denies any suicidal ideations or self injurious ideations, denies any homicidal ideations,   Homicidal Thoughts:  No- denies any homicidal or violent ideations   Memory:  recent and remote grossly intact   Judgement:  Other:  improved   Insight:  improving   Psychomotor Activity:  Normal  Concentration:  Good  Recall:  Good  Fund of Knowledge:Good  Language: Good  Akathisia:  Negative  Handed:  Right  AIMS (if indicated):     Assets:  Communication Skills Desire for Improvement Resilience  Sleep:  Number of Hours: 6.75  Cognition: WNL  ADL's:  Intact   Mental Status Per Nursing Assessment::   On Admission:     Demographic Factors:  20 year old single male, college student , lives in dorm  Loss Factors: Relationship stressors, college stressors   Historical  Factors: History of anxiety, depression , no history of suicide attempts , no history of self injurious ideations, no history of violence   Risk Reduction Factors:   Sense of responsibility to family, Living with another person, especially a relative, Positive social support and Positive coping skills or problem solving skills  Continued Clinical Symptoms:  At this time patient is significantly improved compared to admission- denies depression, denies sadness, reports some ongoing anxiety, but presents significantly improved compared to admission, affect is appropriate, smiles at times appropriately, and appears calm and  More comfortable, better related than on admission. No thought disorder, no hallucinations, no delusions, future oriented , planning on going to a music concert later this weekend . Denies medication side effects- tolerating Celexa well- states celexa more effective than prior trial, and thus far denies side effects. Denies any emergence of suicidal or self injurious ideations- we reviewed medication side effects. At patient's request and with  his express consent and in his presence I spoke with his mother via phone- she corroborates that he is improved and agrees that he is ready to discharge- she is going to pick him up later today.  Cognitive Features That Contribute To Risk:  No gross cognitive deficits noted upon discharge. Is alert , attentive, and oriented x 3   Suicide Risk:  Mild:  Suicidal ideation of limited frequency, intensity, duration, and specificity.  There are no identifiable plans, no associated intent, mild dysphoria and related symptoms, good self-control (both objective and subjective assessment), few other risk factors, and identifiable protective factors, including available and accessible social support.  Follow-up  Information    Follow up with [facility] On 04/20/2015.   Specialty:  Behavioral Health   Why:  at 3:40pm for medication management with Arlana Hove.    Contact [address] [place] [address] [phone]       Follow up with Raynelle Dick On 03/29/2015.   Why:  at 3:00pm for therapy   Contact information:   [address], [place] [phone] Fax: [phone]      Plan Of Care/Follow-up recommendations:  Activity:  as tolerated  Diet:  Regular Tests:  NA Other:  see below Patient has requested discharge and there are no current grounds for involuntary commitment  He is leaving unit in good spirits  He plans to return to college Follow up as above .  Nehemiah Massed, MD 03/25/2015, 12:12 PM

## 2015-03-25 NOTE — Progress Notes (Signed)
Recreation Therapy Notes  Date: 02.17.2017 Time: 9:30am Location: [address] Group room   Group Topic: Stress Management  Goal Area(s) Addresses:  Patient will actively participate in stress management techniques presented during session.   Behavioral Response: Attentive, Appropriate   Intervention: Stress management techniques  Activity :  Deep Breathing and Mindfulness. LRT provided instruction and demonstration on practice of Mindfulness. Technique was coupled with deep breathing.   Education:  Stress Management, Discharge Planning.   Education Outcome: Acknowledges education  Clinical Observations/Feedback: Patient actively engaged in technique introduced, expressed no concerns and demonstrated ability to practice independently post d/c.   Marykay Lex Jamyiah Labella, LRT/CTRS        Madex Seals L 03/25/2015 3:02 PM

## 2015-03-25 NOTE — Progress Notes (Signed)
Patient discharged to home this shift.  Patient was accompanied home by his mother and was in a bright mood upon discharge.  Patient reported some anxiety about going home, but was able to contract for safety.

## 2015-03-25 NOTE — Progress Notes (Signed)
  [facility] Adult Case Management Discharge Plan :  Will you be returning to the same living situation after discharge:  Yes,  Pt returning to campus housing At discharge, do you have transportation home?: Yes,  Pt provided with bus pass if friend is unable to transport Do you have the ability to pay for your medications: Yes,  Pt provided with prescriptions  Release of information consent forms completed and in the chart;  Patient's signature needed at discharge.  Patient to Follow up at: Follow-up Information    Follow up with [facility] On 04/20/2015.   Specialty:  Behavioral Health   Why:  at 3:40pm for medication management with Brett Meza.    Contact [address] [place] [address] [phone]       Follow up with Brett Meza On 03/29/2015.   Why:  at 3:00pm for therapy   Contact information:   [address], [place] [phone] Fax: [phone]      Next level of care provider has access to [facility] Link:no  Safety Planning and Suicide Prevention discussed: Yes,  with mother  Have you used any form of tobacco in the last 30 days? (Cigarettes, Smokeless Tobacco, Cigars, and/or Pipes): No  Has patient been referred to the Quitline?: N/A patient is not a smoker  Patient has been referred for addiction treatment: N/A  Brett Meza 03/25/2015, 10:19 AM

## 2015-03-25 NOTE — BHH Group Notes (Signed)
[facility] LCSW Aftercare Discharge Planning Group Note  03/25/2015 8:45 AM  Participation Quality: Alert, Appropriate and Oriented  Mood/Affect: Appropriate  Depression Rating: 0  Anxiety Rating: 2-3  Thoughts of Suicide: Pt denies SI/HI  Will you contract for safety? Yes  Current AVH: Pt denies  Plan for Discharge/Comments: Pt attended discharge planning group and actively participated in group. CSW discussed suicide prevention education with the group and encouraged them to discuss discharge planning and any relevant barriers. Pt reports feeling well today and is hopeful for discharge.  Transportation Means: Pt reports access to transportation  Supports: No supports mentioned at this time  Chad Cordial, LCSWA 03/25/2015 9:32 AM

## 2015-03-25 NOTE — Tx Team (Signed)
Interdisciplinary Treatment Plan Update (Adult) Date: 03/25/2015   Date: 03/25/2015 10:16 AM  Progress in Treatment:  Attending groups: Yes  Participating in groups: Yes  Taking medication as prescribed: Yes  Tolerating medication: Yes  Family/Significant othe contact made: Yes, with mother Patient understands diagnosis: Yes AEB seeking help with depression Discussing patient identified problems/goals with staff: Yes  Medical problems stabilized or resolved: Yes  Denies suicidal/homicidal ideation: Yes Patient has not harmed self or Others: Yes   New problem(s) identified: None identified at this time.   Discharge Plan or Barriers: Pt will return to his on-campus housing and follow-up with [facility] for medications and Sharlyn Bologna for therapy.  Additional comments:  Patient and CSW reviewed pt's identified goals and treatment plan. Patient verbalized understanding and agreed to treatment plan. CSW reviewed [facility] "Discharge Process and Patient Involvement" Form. Pt verbalized understanding of information provided and signed form.   Reason for Continuation of Hospitalization:  Anxiety Depression Medication stabilization Suicidal ideation  Estimated length of stay: 0 days  Review of initial/current patient goals per problem list:   1.  Goal(s): Patient will participate in aftercare plan  Met:  Yes  Target date: 3-5 days from date of admission   As evidenced by: Patient will participate within aftercare plan AEB aftercare provider and housing plan at discharge being identified.   03/23/15:  Pt will return to his on-campus housing and follow-up with [facility] for medications and Sharlyn Bologna for therapy.  2.  Goal (s): Patient will exhibit decreased depressive symptoms and suicidal ideations.  Met:  Yes  Target date: 3-5 days from date of admission   As evidenced by: Patient will utilize self rating of depression at 3 or below and demonstrate decreased signs of depression or  be deemed stable for discharge by MD.  03/23/15: Pt rates depression at 5/10; denies SI  03/25/15: Pt rates depression at 0/10; denies SI  3.  Goal(s): Patient will demonstrate decreased signs and symptoms of anxiety.  Met:  Yes  Target date: 3-5 days from date of admission   As evidenced by: Patient will utilize self rating of anxiety at 3 or below and demonstrated decreased signs of anxiety, or be deemed stable for discharge by MD  03/23/15: Pt rates anxiety at 5/10  03/25/15: Pt rates anxiety at 0/10  Attendees:  Patient:    Family:    Physician: Dr. Parke Poisson, MD  03/25/2015 10:16 AM  Nursing: Lars Pinks, RN Case manager  03/25/2015 10:16 AM  Clinical Social Worker Peri Maris, [place] 03/25/2015 10:16 AM  Other: Erasmo Downer Drinkard, [place] 03/25/2015 10:16 AM  Clinical: Chuck Hint, RN; Idell Pickles, RN 03/25/2015 10:16 AM  Other: , RN Charge Nurse 03/25/2015 10:16 AM  Other: Hilda Lias, [place], [place] Work [phone]

## 2015-03-25 NOTE — Discharge Summary (Signed)
Physician Discharge Summary Note  Patient:  Brett Meza is an 20 y.o., male MRN:  [id number] DOB:  1996/01/10 Patient phone:  [phone] (home)  Patient address:   [address] [place] [address],  Total Time spent with patient: 30 minutes  Date of Admission:  03/22/2015 Date of Discharge: 03/25/2015  Reason for Admission:  depression  Principal Problem: Major depressive disorder, recurrent, severe without psychotic features [facility]) Discharge Diagnoses: Patient Active Problem List   Diagnosis Date Noted  . Major depressive disorder, recurrent, severe without psychotic features (HCC) [F33.2] 03/22/2015    Past Psychiatric History:  See above noted  Past Medical History:  Past Medical History  Diagnosis Date  . Depression   . Schizophrenia, childhood     Past Surgical History  Procedure Laterality Date  . Anterior cruciate ligament repair     Family History: History reviewed. No pertinent family history. Family Psychiatric  History:  See above noted Social History:  History  Alcohol Use No     History  Drug Use No    Social History   Social History  . Marital Status: Single    Spouse Name: N/A  . Number of Children: N/A  . Years of Education: N/A   Social History Main Topics  . Smoking status: Never Smoker   . Smokeless tobacco: None  . Alcohol Use: No  . Drug Use: No  . Sexual Activity: Not Asked   Other Topics Concern  . None   Social History Narrative    Hospital Course:  Brett Meza was admitted for Major depressive disorder, recurrent, severe without psychotic features (HCC) and crisis management.  He was treated with the following medications listed below.  Brett Meza was discharged with current medication and was instructed on how to take medications as prescribed; (details listed below under Medication List).  Medical problems were identified and treated as needed.  Home medications were restarted as appropriate.  Improvement was monitored by  observation and Brett Meza daily report of symptom reduction.  Emotional and mental status was monitored by daily self-inventory reports completed by Brett Meza and clinical staff.         Brett Meza was evaluated by the treatment team for stability and plans for continued recovery upon discharge.  Brett Meza motivation was an integral factor for scheduling further treatment.  Employment, transportation, bed availability, health status, family support, and any pending legal issues were also considered during his hospital stay.  He was offered further treatment options upon discharge including but not limited to Residential, Intensive Outpatient, and Outpatient treatment.  Brett Meza will follow up with the services as listed below under Follow Up Information.     Upon completion of this admission the Brett Meza was both mentally and medically stable for discharge denying suicidal/homicidal ideation, auditory/visual/tactile hallucinations, delusional thoughts and paranoia.     Physical Findings: AIMS: Facial and Oral Movements Muscles of Facial Expression: None, normal Lips and Perioral Area: None, normal Jaw: None, normal Tongue: None, normal,Extremity Movements Upper (arms, wrists, hands, fingers): None, normal Lower (legs, knees, ankles, toes): None, normal, Trunk Movements Neck, shoulders, hips: None, normal, Overall Severity Severity of abnormal movements (highest score from questions above): None, normal Incapacitation due to abnormal movements: None, normal Patient's awareness of abnormal movements (rate only patient's report): No Awareness, Dental Status Current problems with teeth and/or dentures?: No Does patient usually wear dentures?: No  CIWA:    COWS:     Musculoskeletal: Strength & Muscle Tone: within  normal limits Gait & Station: normal Patient leans: N/A  Psychiatric Specialty Exam:  SEE MD SRA Review of Systems  All other systems reviewed and are negative.   Blood  pressure 116/69, pulse 61, temperature 97.8 F (36.6 C), temperature source Oral, resp. rate 16, height  (1.803 m), weight 65.772 kg (145 lb), SpO2 100 %.Body mass index is 20.23 kg/(m^2).  Have you used any form of tobacco in the last 30 days? (Cigarettes, Smokeless Tobacco, Cigars, and/or Pipes): No  Has this patient used any form of tobacco in the last 30 days? (Cigarettes, Smokeless Tobacco, Cigars, and/or Pipes) Yes, NA  Blood Alcohol level:  Lab Results  Component Value Date   ETH <5 03/21/2015    Metabolic Disorder Labs:  No results found for: HGBA1C, MPG No results found for: PROLACTIN No results found for: CHOL, TRIG, HDL, CHOLHDL, VLDL, LDLCALC  See Psychiatric Specialty Exam and Suicide Risk Assessment completed by Attending Physician prior to discharge.  Discharge destination:  Home  Is patient on multiple antipsychotic therapies at discharge:  No   Has Patient had three or more failed trials of antipsychotic monotherapy by history:  No  Recommended Plan for Multiple Antipsychotic Therapies: NA     Medication List    STOP taking these medications        FLUoxetine 20 MG tablet  Commonly known as:  PROZAC      TAKE these medications      Indication   citalopram 10 MG tablet  Commonly known as:  CELEXA  Take 1 tablet (10 mg total) by mouth daily.   Indication:  Depression     traZODone 50 MG tablet  Commonly known as:  DESYREL  Take 1 tablet (50 mg total) by mouth at bedtime as needed for sleep.   Indication:  Trouble Sleeping           Follow-up Information    Follow up with [facility] On 04/20/2015.   Specialty:  Behavioral Health   Why:  at 3:40pm for medication management with Brett Meza.    [address] [place] [address] [phone]       Follow up with Brett Meza On 03/29/2015.   Why:  at 3:00pm for therapy   Contact information:   [address] [phone] Fax: [phone]       Follow-up recommendations:  Activity:  as tol Diet:  as tol  Comments:  1.  Take all your medications as prescribed.              2.  Report any adverse side effects to outpatient provider.                       3.  Patient instructed to not use alcohol or illegal drugs while on prescription medicines.            4.  In the event of worsening symptoms, instructed patient to call 911, the crisis hotline or go to nearest emergency room for evaluation of symptoms.  Signed: Lindwood Qua, NP [facility] 03/25/2015, 10:34 AM  Patient seen, Suicide Assessment Completed.  Disposition Plan Reviewed

## 2015-04-02 ENCOUNTER — Encounter (HOSPITAL_COMMUNITY): Payer: Self-pay | Admitting: Emergency Medicine

## 2015-04-02 ENCOUNTER — Emergency Department (HOSPITAL_COMMUNITY)
Admission: EM | Admit: 2015-04-02 | Discharge: 2015-04-02 | Disposition: A | Discharge status: Home / Self Care | Payer: Medicaid Other | Attending: Emergency Medicine | Admitting: Emergency Medicine

## 2015-04-02 DIAGNOSIS — R369 Urethral discharge, unspecified: Secondary | ICD-10-CM | POA: Insufficient documentation

## 2015-04-02 DIAGNOSIS — F329 Major depressive disorder, single episode, unspecified: Secondary | ICD-10-CM | POA: Insufficient documentation

## 2015-04-02 DIAGNOSIS — Z711 Person with feared health complaint in whom no diagnosis is made: Secondary | ICD-10-CM

## 2015-04-02 DIAGNOSIS — Z79899 Other long term (current) drug therapy: Secondary | ICD-10-CM | POA: Insufficient documentation

## 2015-04-02 DIAGNOSIS — Z202 Contact with and (suspected) exposure to infections with a predominantly sexual mode of transmission: Secondary | ICD-10-CM | POA: Diagnosis not present

## 2015-04-02 LAB — URINALYSIS, ROUTINE W REFLEX MICROSCOPIC
Bilirubin Urine: NEGATIVE
Glucose, UA: NEGATIVE mg/dL
Hgb urine dipstick: NEGATIVE
Ketones, ur: NEGATIVE mg/dL
Leukocytes, UA: NEGATIVE
NITRITE: NEGATIVE
Protein, ur: NEGATIVE mg/dL
SPECIFIC GRAVITY, URINE: 1.024 (ref 1.005–1.030)
pH: 5.5 (ref 5.0–8.0)

## 2015-04-02 MED ORDER — AZITHROMYCIN 1 G PO PACK
1.0000 g | PACK | Freq: Once | ORAL | Status: AC
  Administered 2015-04-02: 1 g via ORAL
  Filled 2015-04-02: qty 1

## 2015-04-02 MED ORDER — ONDANSETRON 4 MG PO TBDP
8.0000 mg | ORAL_TABLET | Freq: Once | ORAL | Status: DC
  Filled 2015-04-02: qty 2

## 2015-04-02 MED ORDER — CEFTRIAXONE SODIUM 250 MG IJ SOLR
250.0000 mg | Freq: Once | INTRAMUSCULAR | Status: AC
  Administered 2015-04-02: 250 mg via INTRAMUSCULAR
  Filled 2015-04-02: qty 250

## 2015-04-02 MED ORDER — LIDOCAINE HCL (PF) 1 % IJ SOLN
5.0000 mL | Freq: Once | INTRAMUSCULAR | Status: AC
  Administered 2015-04-02: 5 mL via INTRADERMAL
  Filled 2015-04-02: qty 5

## 2015-04-02 NOTE — ED Notes (Signed)
Declined W/C at D/C and was escorted to lobby by RN. 

## 2015-04-02 NOTE — Discharge Instructions (Signed)
Sexually Transmitted Disease  A sexually transmitted disease (STD) is a disease or infection that may be passed (transmitted) from person to person, usually during sexual activity. This may happen by way of saliva, semen, blood, vaginal mucus, or urine. Common STDs include:  · Gonorrhea.  · Chlamydia.  · Syphilis.  · HIV and AIDS.  · Genital herpes.  · Hepatitis B and C.  · Trichomonas.  · Human papillomavirus (HPV).  · Pubic lice.  · Scabies.  · Mites.  · Bacterial vaginosis.  WHAT ARE CAUSES OF STDs?  An STD may be caused by bacteria, a virus, or parasites. STDs are often transmitted during sexual activity if one person is infected. However, they may also be transmitted through nonsexual means. STDs may be transmitted after:   · Sexual intercourse with an infected person.  · Sharing sex toys with an infected person.  · Sharing needles with an infected person or using unclean piercing or tattoo needles.  · Having intimate contact with the genitals, mouth, or rectal areas of an infected person.  · Exposure to infected fluids during birth.  WHAT ARE THE SIGNS AND SYMPTOMS OF STDs?  Different STDs have different symptoms. Some people may not have any symptoms. If symptoms are present, they may include:  · Painful or bloody urination.  · Pain in the pelvis, abdomen, vagina, anus, throat, or eyes.  · A skin rash, itching, or irritation.  · Growths, ulcerations, blisters, or sores in the genital and anal areas.  · Abnormal vaginal discharge with or without bad odor.  · Penile discharge in men.  · Fever.  · Pain or bleeding during sexual intercourse.  · Swollen glands in the groin area.  · Yellow skin and eyes (jaundice). This is seen with hepatitis.  · Swollen testicles.  · Infertility.  · Sores and blisters in the mouth.  HOW ARE STDs DIAGNOSED?  To make a diagnosis, your health care provider may:  · Take a medical history.  · Perform a physical exam.  · Take a sample of any discharge to examine.  · Swab the throat,  cervix, opening to the penis, rectum, or vagina for testing.  · Test a sample of your first morning urine.  · Perform blood tests.  · Perform a Pap test, if this applies.  · Perform a colposcopy.  · Perform a laparoscopy.  HOW ARE STDs TREATED?  Treatment depends on the STD. Some STDs may be treated but not cured.  · Chlamydia, gonorrhea, trichomonas, and syphilis can be cured with antibiotic medicine.  · Genital herpes, hepatitis, and HIV can be treated, but not cured, with prescribed medicines. The medicines lessen symptoms.  · Genital warts from HPV can be treated with medicine or by freezing, burning (electrocautery), or surgery. Warts may come back.  · HPV cannot be cured with medicine or surgery. However, abnormal areas may be removed from the cervix, vagina, or vulva.  · If your diagnosis is confirmed, your recent sexual partners need treatment. This is true even if they are symptom-free or have a negative culture or evaluation. They should not have sex until their health care providers say it is okay.  · Your health care provider may test you for infection again 3 months after treatment.  HOW CAN I REDUCE MY RISK OF GETTING AN STD?  Take these steps to reduce your risk of getting an STD:  · Use latex condoms, dental dams, and water-soluble lubricants during sexual activity. Do not use   petroleum jelly or oils.  · Avoid having multiple sex partners.  · Do not have sex with someone who has other sex partners  · Do not have sex with anyone you do not know or who is at high risk for an STD.  · Avoid risky sex practices that can break your skin.  · Do not have sex if you have open sores on your mouth or skin.  · Avoid drinking too much alcohol or taking illegal drugs. Alcohol and drugs can affect your judgment and put you in a vulnerable position.  · Avoid engaging in oral and anal sex acts.  · Get vaccinated for HPV and hepatitis. If you have not received these vaccines in the past, talk to your health care  provider about whether one or both might be right for you.  · If you are at risk of being infected with HIV, it is recommended that you take a prescription medicine daily to prevent HIV infection. This is called pre-exposure prophylaxis (PrEP). You are considered at risk if:    You are a man who has sex with other men (MSM).    You are a heterosexual man or woman and are sexually active with more than one partner.    You take drugs by injection.    You are sexually active with a partner who has HIV.  · Talk with your health care provider about whether you are at high risk of being infected with HIV. If you choose to begin PrEP, you should first be tested for HIV. You should then be tested every 3 months for as long as you are taking PrEP.  WHAT SHOULD I DO IF I THINK I HAVE AN STD?  · See your health care provider.  · Tell your sexual partner(s). They should be tested and treated for any STDs.  · Do not have sex until your health care provider says it is okay.  WHEN SHOULD I GET IMMEDIATE MEDICAL CARE?  Contact your health care provider right away if:   · You have severe abdominal pain.  · You are a man and notice swelling or pain in your testicles.  · You are a woman and notice swelling or pain in your vagina.     This information is not intended to replace advice given to you by your health care provider. Make sure you discuss any questions you have with your health care provider.     Document Released: 04/14/2002 Document Revised: 02/12/2014 Document Reviewed: 08/12/2012  Elsevier Interactive Patient Education ©2016 [employer]    Safe Sex  Safe sex is about reducing the risk of giving or getting a sexually transmitted disease (STD). STDs are spread through sexual contact involving the genitals, mouth, or rectum. Some STDs can be cured and others cannot. Safe sex can also prevent unintended pregnancies.   WHAT ARE SOME SAFE SEX PRACTICES?  · Limit your sexual activity to only one partner who is having sex with  only you.  · Talk to your partner about his or her past partners, past STDs, and drug use.  · Use a condom every time you have sexual intercourse. This includes vaginal, oral, and anal sexual activity. Both females and males should wear condoms during oral sex. Only use latex or polyurethane condoms and water-based lubricants. Using petroleum-based lubricants or oils to lubricate a condom will weaken the condom and increase the chance that it will break. The condom should be in place from the beginning to   the end of sexual activity. Wearing a condom reduces, but does not completely eliminate, your risk of getting or giving an STD. STDs can be spread by contact with infected body fluids and skin.  · Get vaccinated for hepatitis B and HPV.  · Avoid alcohol and recreational drugs, which can affect your judgment. You may forget to use a condom or participate in high-risk sex.  · For females, avoid douching after sexual intercourse. Douching can spread an infection farther into the reproductive tract.  · Check your body for signs of sores, blisters, rashes, or unusual discharge. See your health care provider if you notice any of these signs.  · Avoid sexual contact if you have symptoms of an infection or are being treated for an STD. If you or your partner has herpes, avoid sexual contact when blisters are present. Use condoms at all other times.  · If you are at risk of being infected with HIV, it is recommended that you take a prescription medicine daily to prevent HIV infection. This is called pre-exposure prophylaxis (PrEP). You are considered at risk if:    You are a man who has sex with other men (MSM).    You are a heterosexual man or woman who is sexually active with more than one partner.    You take drugs by injection.    You are sexually active with a partner who has HIV.  · Talk with your health care provider about whether you are at high risk of being infected with HIV. If you choose to begin PrEP, you  should first be tested for HIV. You should then be tested every 3 months for as long as you are taking PrEP.  · See your health care provider for regular screenings, exams, and tests for other STDs. Before having sex with a new partner, each of you should be screened for STDs and should talk about the results with each other.  WHAT ARE THE BENEFITS OF SAFE SEX?   · There is less chance of getting or giving an STD.  · You can prevent unwanted or unintended pregnancies.  · By discussing safe sex concerns with your partner, you may increase feelings of intimacy, comfort, trust, and honesty between the two of you.     This information is not intended to replace advice given to you by your health care provider. Make sure you discuss any questions you have with your health care provider.     Document Released: 03/01/2004 Document Revised: 02/12/2014 Document Reviewed: 07/16/2011  Elsevier Interactive Patient Education ©2016 Elsevier Inc.

## 2015-04-02 NOTE — ED Provider Notes (Signed)
CSN: [id number]     Arrival date & time 04/02/15  1113 History  By signing my name below, I, Brett Meza, attest that this documentation has been prepared under the direction and in the presence of Brett Fowler, PA-C Electronically Signed: Soijett Meza, ED Scribe. 04/02/2015. 12:02 PM.   Chief Complaint  Patient presents with  . Urinary Tract Infection      The history is provided by the patient. No language interpreter was used.    Brett Meza is a 20 y.o. male who presents to the Emergency Department complaining of green penile discharge onset 2 months. He notes that he thought that he had a UTI and he noticed some pus forming to the tip of the penis. He denies any alleviating factors. Pt denies the discharge occuring at any time in particular of the day. Pt denies having vaginal intercourse, but he has done other sexual activities. He states that she is having associated symptoms of penile irritation to the tip of his penis. He states that he has tried vinegar and OTC bacterial cream with mild relief for his symptoms. He denies dysuria, fever, chills, n/v, testicular pain/swelling, abdominal pain, and any other symptoms. Denies allergies to any medications.    Past Medical History  Diagnosis Date  . Depression   . Schizophrenia, childhood    Past Surgical History  Procedure Laterality Date  . Anterior cruciate ligament repair     No family history on file. Social History  Substance Use Topics  . Smoking status: Never Smoker   . Smokeless tobacco: None  . Alcohol Use: No    Review of Systems  Constitutional: Negative for fever and chills.  Gastrointestinal: Negative for abdominal pain.  Genitourinary: Positive for discharge (green). Negative for dysuria, penile swelling, scrotal swelling, penile pain and testicular pain.  All other systems reviewed and are negative.     Allergies  Review of patient's allergies indicates no known allergies.  Home Medications   Prior to  Admission medications   Medication Sig Start Date End Date Taking? Authorizing Provider  citalopram (CELEXA) 10 MG tablet Take 1 tablet (10 mg total) by mouth daily. 03/25/15   Adonis Brook, NP  traZODone (DESYREL) 50 MG tablet Take 1 tablet (50 mg total) by mouth at bedtime as needed for sleep. 03/25/15   Adonis Brook, NP   BP 115/67 mmHg  Pulse 69  Temp(Src) 98.2 F (36.8 C) (Oral)  Resp 20  SpO2 97% Physical Exam  Constitutional: He is oriented to person, place, and time. He appears well-developed and well-nourished.  HENT:  Head: Normocephalic and atraumatic.  Right Ear: External ear normal.  Left Ear: External ear normal.  Eyes: Conjunctivae are normal. No scleral icterus.  Neck: No tracheal deviation present.  Pulmonary/Chest: Effort normal. No respiratory distress.  Abdominal: He exhibits no distension. There is no tenderness.  Genitourinary: Testes normal and penis normal. Right testis shows no swelling and no tenderness. Left testis shows no swelling and no tenderness. Uncircumcised. No penile erythema or penile tenderness. No discharge found.  Scribe chaperone present for exam.  Musculoskeletal: Normal range of motion.  Neurological: He is alert and oriented to person, place, and time.  Skin: Skin is warm and dry.  Psychiatric: He has a normal mood and affect. His behavior is normal.  Nursing note and vitals reviewed.   ED Course  Procedures (including critical care time) DIAGNOSTIC STUDIES: Oxygen Saturation is 97% on RA, nl by my interpretation.    COORDINATION OF CARE:  12:01 PM Discussed treatment plan with pt at bedside which includes UA, GC/chlamydia probe and pt agreed to plan.    Labs Review Labs Reviewed  URINALYSIS, ROUTINE W REFLEX MICROSCOPIC (NOT AT [facility])  RPR  HIV ANTIBODY (ROUTINE TESTING)  GC/CHLAMYDIA PROBE AMP ([place]) NOT AT [facility]    Imaging Review No results found. I have personally reviewed and evaluated these lab results as part  of my medical decision-making.   EKG Interpretation None      MDM   Final diagnoses:  Concern about STD in male without diagnosis  Penile discharge    Patient treated in the ED for STI with rocephin and azithromycin. Patient advised to inform and treat all sexual partners.  Pt advised on safe sex practices and understands that they have GC/Chlamydia cultures pending and will result in 2-3 days. HIV and RPR sent. Pt encouraged to follow up at local health department for future STI checks. UA negative. No concern for prostatitis or epididymitis. Discussed return precautions. Pt appears safe for discharge.   I personally performed the services described in this documentation, which was scribed in my presence. The recorded information has been reviewed and is accurate.    Brett Fowler, PA-C 04/02/15 1432  Alvira Monday, MD 04/03/15 [phone]

## 2015-04-02 NOTE — ED Notes (Signed)
Pt c/o thought he had an UTI x 2 months. Pt tried drinking vinegar. Pt reports more irritation to penis area and green discharge.

## 2015-04-03 LAB — HIV ANTIBODY (ROUTINE TESTING W REFLEX): HIV Screen 4th Generation wRfx: NONREACTIVE

## 2015-04-03 LAB — RPR: RPR: NONREACTIVE

## 2015-04-04 LAB — GC/CHLAMYDIA PROBE AMP (~~LOC~~) NOT AT ARMC
Chlamydia: NEGATIVE
NEISSERIA GONORRHEA: NEGATIVE

## 2015-05-01 ENCOUNTER — Emergency Department (HOSPITAL_COMMUNITY)
Admission: EM | Admit: 2015-05-01 | Discharge: 2015-05-01 | Disposition: A | Discharge status: Home / Self Care | Payer: Medicaid Other | Attending: Emergency Medicine | Admitting: Emergency Medicine

## 2015-05-01 ENCOUNTER — Encounter (HOSPITAL_COMMUNITY): Payer: Self-pay | Admitting: Emergency Medicine

## 2015-05-01 DIAGNOSIS — F329 Major depressive disorder, single episode, unspecified: Secondary | ICD-10-CM | POA: Diagnosis not present

## 2015-05-01 DIAGNOSIS — Z760 Encounter for issue of repeat prescription: Secondary | ICD-10-CM | POA: Insufficient documentation

## 2015-05-01 MED ORDER — CITALOPRAM HYDROBROMIDE 10 MG PO TABS: 10.0000 mg | ORAL_TABLET | Freq: Every day | ORAL | Status: DC

## 2015-05-01 NOTE — ED Provider Notes (Signed)
CSN: [id number]     Arrival date & time 05/01/15  4098 History  By signing my name below, I, Tanda Rockers, attest that this documentation has been prepared under the direction and in the presence of Brett Joy, PA-C. Electronically Signed: Tanda Rockers, ED Scribe. 05/01/2015. 11:08 AM.   Chief Complaint  Patient presents with  . Medication Refill   The history is provided by the patient. No language interpreter was used.     HPI Comments: Brett Meza is a 20 y.o. male with PMHx depression and schizophrenia who presents to the Emergency Department for medication refill of Citalopram and Trazodone. Pt states that he missed his appointment with his psychiatrist on 3/15 (approximately 12 days ago) while on spring break and therefore could not get his prescriptions refilled. He has not called his psychiatrist in between the 15th and now to reschedule an appointment. Pt has no complaints at this time.    Past Medical History  Diagnosis Date  . Depression   . Schizophrenia, childhood    Past Surgical History  Procedure Laterality Date  . Anterior cruciate ligament repair     No family history on file. Social History  Substance Use Topics  . Smoking status: Never Smoker   . Smokeless tobacco: None  . Alcohol Use: No    Review of Systems  Constitutional: Negative for fever.  Psychiatric/Behavioral: Negative for confusion and dysphoric mood.       Medication refill   Allergies  Review of patient's allergies indicates no known allergies.  Home Medications   Prior to Admission medications   Medication Sig Start Date End Date Taking? Authorizing Provider  citalopram (CELEXA) 10 MG tablet Take 1 tablet (10 mg total) by mouth daily. 03/25/15   Adonis Brook, NP  citalopram (CELEXA) 10 MG tablet Take 1 tablet (10 mg total) by mouth daily. 05/01/15   Brett C Joy, PA-C  traZODone (DESYREL) 50 MG tablet Take 1 tablet (50 mg total) by mouth at bedtime as needed for sleep. 03/25/15   Adonis Brook, NP   BP 108/68 mmHg  Pulse 66  Temp(Src) 97.8 F (36.6 C)  Resp 18  Ht  (1.803 m)  Wt 69.06 kg  BMI 21.24 kg/m2  SpO2 100%   Physical Exam  Constitutional: He is oriented to person, place, and time. He appears well-developed and well-nourished. No distress.  HENT:  Head: Normocephalic and atraumatic.  Eyes: Conjunctivae are normal.  Neck: Neck supple.  Cardiovascular: Normal rate.   Pulmonary/Chest: Effort normal. No respiratory distress.  Musculoskeletal: He exhibits no edema.  Neurological: He is alert and oriented to person, place, and time.  Skin: Skin is warm and dry.  Psychiatric: He has a normal mood and affect. His behavior is normal.  Nursing note and vitals reviewed.   ED Course  Procedures (including critical care time)  DIAGNOSTIC STUDIES: Oxygen Saturation is 100% on RA, normal by my interpretation.    COORDINATION OF CARE: 11:08 AM-Discussed treatment plan which includes medication refill with pt at bedside and pt agreed to plan.     MDM   Final diagnoses:  Medication refill   Brett Meza presents for medication refill.  Patient was counseled on the inappropriate use of the ED for medication refill. Patient Celexa was refilled, but not his trazodone due to a system limitation that would not allow this prescription to be written. Patient was advised to follow-up with his psychiatrist as soon as possible. Patient voiced understanding of these instructions and  agreed to the plan.   I personally performed the services described in this documentation, which was scribed in my presence. The recorded information has been reviewed and is accurate.   Anselm PancoastShawn C Joy, PA-C 05/01/15 1157  Brett Spatesachel Morgan Little, MD 05/02/15 [phone]

## 2015-05-01 NOTE — ED Notes (Signed)
[facility] but missed his last appt. Because he was on spring break. Pt is requesting a refill on Celexa 10 mg tabs. Pt has empty bottle with him.

## 2015-05-01 NOTE — Discharge Instructions (Signed)
You have been seen today for a medication refill. Our system would not allow a refill of your trazodone. You will have to follow up with your psychiatrist as soon as possible to get a proper refill. Follow up with PCP as needed. Return to ED should symptoms worsen.

## 2015-05-01 NOTE — ED Notes (Signed)
Pt is here for refill of his psych medication. Pt ran out of medication on Friday.

## 2015-05-01 NOTE — ED Notes (Signed)
Declined W/C at D/C and was escorted to lobby by RN. 

## 2015-10-31 ENCOUNTER — Ambulatory Visit (INDEPENDENT_AMBULATORY_CARE_PROVIDER_SITE_OTHER): Payer: Medicaid Other | Admitting: Sports Medicine

## 2015-10-31 ENCOUNTER — Encounter: Payer: Self-pay | Admitting: Sports Medicine

## 2015-10-31 VITALS — BP 106/49 | Ht 71.0 in | Wt 150.0 lb

## 2015-10-31 DIAGNOSIS — M25532 Pain in left wrist: Secondary | ICD-10-CM | POA: Diagnosis not present

## 2015-10-31 DIAGNOSIS — M25512 Pain in left shoulder: Secondary | ICD-10-CM

## 2015-10-31 MED ORDER — MELOXICAM 15 MG PO TABS: ORAL_TABLET | ORAL | 0 refills | Status: AC

## 2015-10-31 NOTE — Progress Notes (Signed)
Subjective:  Patient ID: Brett Meza, male    DOB: 1995/08/17  Age: 20 y.o. MRN: [id number]  CC: Left Wrist Pain  HPI Brett Meza is a 20 year old male with PMH significant for major depressive disorder presents today with left wrist pain. Patient reports left wrist pain started 4 weeks ago while playing the violin. Patient decided to brace his wrist and then after 2 weeks decided to also take ibuprofen and ice wrist every day. Patient reported the pain started to get better and pain was only brought about by moving or twisting the wrist.   4 days ago patient reports that he was skateboarding with friends and fell about 2 to 3 times with his hands outstretched. Patient reports that since then he has had left wrist pain at rest. Patient denies any numbness or tingling Patient reports that he has had wrist sprains in the past, but they have not hurt as much as this.   Patient also reports that a few months ago at a skating event he fell on his left shoulder and never got it examined. Patient reports shoulder pain with moving his arm.    ROS Review of Systems  Musculoskeletal:       Left wrist pain  All other systems reviewed and are negative.   Objective:  BP (!) 106/49   Ht 5\' 11"  (1.803 m)   Wt 150 lb (68 kg)   BMI 20.92 kg/m   Physical Exam  Constitutional: He appears well-developed and well-nourished.  Musculoskeletal:       Left shoulder: He exhibits pain. He exhibits normal range of motion, no swelling and normal strength.       Left wrist: He exhibits tenderness. He exhibits normal range of motion, no swelling, no deformity and no laceration.  Left shoulder: pain elicited upon extension of arm Left wrist: mild tenderness in ulnar side of wrist and anatomical snuff box     Assessment & Plan:   1. Left shoulder pain secondary [facility] past [facility] Informed patient that we want to rule out previous shoulder injury. Will order x-rays of left shoulder. Will call patient  after x-rays are taken in regards to follow-up.  Ordered: AP & Lateral L Shoulder X-Ray   2. [facility] Informed patient that he can still continue playing violin, but we want [facility] rule [facility]. Patient instructed to keep wrist in brace and take meloxicam q1d w/ food for 7 days and prn after 7 days. Will order x-rays. Will call patient after x-rays are taken in regards to follow-up.  Ordered:  AP, Lateral, & Scaphoid L Wrist X-Ray 15 MG Mobic Cock-Up Wrist Brace    Meds ordered this encounter  Medications  . meloxicam (MOBIC) 15 MG tablet    Sig: Take one pill a day with food for 7 days then as needed    Dispense:  40 tablet    Refill:  0    Follow-up: No Follow-up on file.   Brett HarpsJohn Adeolu Kyrin Meza, Medical Student   Patient seen and evaluated with medical student. History is as above. Physical exam of the left shoulder showed full range of motion. No gross deformity. Good strength. Examination of the left wrist shows full range of motion. He is tender to palpation over the ulnar styloid. Otherwise, no other tenderness to palpation. No tenderness in the anatomic snuff box. No swelling. No ecchymosis. Good grip strength. Pulses are intact. I'm going to get  some x-rays of his left shoulder and left wrist. I will call him with those results once available. In the meantime, I will place him into a cockup wrist brace to wear for comfort and I will put him on meloxicam to take for the next 7 days. We'll delineate definitive treatment based on his x-ray results.

## 2015-11-04 ENCOUNTER — Ambulatory Visit
Admission: RE | Admit: 2015-11-04 | Discharge: 2015-11-04 | Disposition: A | Discharge status: Home / Self Care | Payer: Medicaid Other | Source: Ambulatory Visit | Attending: Sports Medicine | Admitting: Sports Medicine

## 2015-11-04 DIAGNOSIS — M25512 Pain in left shoulder: Secondary | ICD-10-CM

## 2015-11-04 DIAGNOSIS — M25532 Pain in left wrist: Secondary | ICD-10-CM

## 2015-11-07 ENCOUNTER — Telehealth: Payer: Self-pay | Admitting: Sports Medicine

## 2015-11-07 NOTE — Telephone Encounter (Signed)
I spoke with the patient on the phone today after reviewing x-rays of both his left shoulder and his left wrist. His x-rays are unremarkable. I've asked that he give his injuries a little more time. He has meloxicam to take if needed. If symptoms persist we could consider further diagnostic imaging. Follow-up for ongoing or recalcitrant issues.

## 2016-02-27 ENCOUNTER — Ambulatory Visit (INDEPENDENT_AMBULATORY_CARE_PROVIDER_SITE_OTHER): Payer: Medicaid Other | Admitting: Sports Medicine

## 2016-02-27 DIAGNOSIS — S8992XA Unspecified injury of left lower leg, initial encounter: Secondary | ICD-10-CM

## 2016-02-27 NOTE — Progress Notes (Signed)
   Subjective:    Patient ID: Brett Meza, male    DOB: 06/20/1995, 21 y.o.   MRN: [id number]   CC: left knee pain  HPI: 21 y/o F with a PMH of left acl tear presents for left knee pain after fall  Left knee pain - 5 days ago was playing in the snow and slipped while trying to throw a snowball - he slipped and fell on the outside of his left knee  - he scraped it and had swelling overit for 1 day - his pain has started to improve such that now he only has it with going up and down stairs - he was able to ride his bike today without pain - he denies weakness ore reduced ROM  Pertinent past medical history- had an acl tear with partial meniscectomy in 2015  Review of Systems  Per HPI,   Objective:  BP 138/72   Pulse 73   Ht 5\' 9"  (1.753 m)   Wt 150 lb (68 kg)   BMI 22.15 kg/m  Vitals and nursing note reviewed  General: NAD MSK Left knee with healing laceration over tibial head - mild tenderness to palpation over left tibial head - no swelling or joint effusion noted - negative anterior and posterior drawer test - negative lachman test - negative thessaly - 5/5 strength to hip flexion, knee flexion and extension, foot dorsiflexion and plantar flexion  Assessment & Plan:    Knee injury, left, initial encounter Left knee injury after fall. No evidence of major pathology like fracture, acl or pcl injury, or meniscal injury - will continue conservative measures    Aliyana Dlugosz A. Kennon RoundsHaney MD, MS Family Medicine Resident PGY-3 Pager [phone]  Patient seen and evaluated with the resident. I agree with the above plan of care. I think he has a simple contusion to the anterior lateral aspect of his left knee. No knee effusion. Good ligamentous stability. No evidence of meniscal injury. Reassurance given. Follow-up for ongoing or recalcitrant issues.

## 2016-02-27 NOTE — Assessment & Plan Note (Signed)
Left knee injury after fall. No evidence of major pathology like fracture, acl or pcl injury, or meniscal injury - will continue conservative measures

## 2017-01-23 ENCOUNTER — Emergency Department (HOSPITAL_COMMUNITY)
Admission: EM | Admit: 2017-01-23 | Discharge: 2017-01-23 | Disposition: A | Discharge status: Home / Self Care | Payer: Self-pay | Attending: Emergency Medicine | Admitting: Emergency Medicine

## 2017-01-23 ENCOUNTER — Other Ambulatory Visit: Payer: Self-pay

## 2017-01-23 ENCOUNTER — Encounter (HOSPITAL_COMMUNITY): Payer: Self-pay | Admitting: Emergency Medicine

## 2017-01-23 DIAGNOSIS — Z79899 Other long term (current) drug therapy: Secondary | ICD-10-CM | POA: Insufficient documentation

## 2017-01-23 DIAGNOSIS — R0602 Shortness of breath: Secondary | ICD-10-CM | POA: Insufficient documentation

## 2017-01-23 NOTE — ED Notes (Addendum)
Pt walks to room with no distress or tachypnea. Pt states "I feel panicked" but resp even and noon labored. Skin warm and dr. Donell Beersolor pink.Pt stats new medication but denies any thoughts of hurting self or others.

## 2017-01-23 NOTE — ED Triage Notes (Signed)
Pt reports "asthma attack" since last night that he believes is related to taking Abilify that he started on 01/16/17.  Denies history of asthma.  Reports he has been having trouble sleeping so he has been taking Benadryl for the last 5 nights.  Reports mild sob started last night 1 hour after taking Abilify but pt was also using Clorox to clean the bathroom.  After cleaning the bathroom he went to bed and took Benadryl and SOB got worse. NAD at this time.

## 2017-01-23 NOTE — ED Notes (Addendum)
Pt states he understands instructions. Home stable with steady gait with family. Again denies pain or denies thoughts of hurting self or other.

## 2017-01-23 NOTE — Discharge Instructions (Signed)
Please read attached information. If you experience any new or worsening signs or symptoms please return to the emergency room for evaluation. Please follow-up with your primary care provider or specialist as discussed.  °

## 2017-01-23 NOTE — ED Provider Notes (Signed)
[name] Provider Note   CSN: [id number] Arrival date & time: 01/23/17  1501     History   Chief Complaint Chief Complaint  Patient presents with  . Shortness of Breath    HPI Brett Meza is a 21 y.o. male.  HPI  21 year old male presents today with complaints of panic attack.  Patient reports that he has been taking all of 5 for the last 8 days.  He notes he has also been taking Benadryl with this is the Abilify is making it difficult for him to sleep.  Patient reports last night he was feeling anxious, felt like he could not breathe, but was able to go to bed.  He notes he woke up this morning, continued the symptoms with worsening anxiety and shortness of breath.  Patient reports at the time of evaluation his symptoms have dramatically improved, no longer having any significant shortness of breath.  No history DVT, no significant risk factors.  No swelling or edema, chest pain.  Past Medical History:  Diagnosis Date  . Depression   . Schizophrenia, childhood     Patient Active Problem List   Diagnosis Date Noted  . Knee injury, left, initial encounter 02/27/2016  . Major depressive disorder, recurrent, severe without psychotic features (HCC) 03/22/2015    Past Surgical History:  Procedure Laterality Date  . ANTERIOR CRUCIATE LIGAMENT REPAIR         Home Medications    Prior to Admission medications   Medication Sig Start Date End Date Taking? Authorizing Provider  citalopram (CELEXA) 20 MG tablet Take 20 mg by mouth daily.    [provider]  lamoTRIgine (LAMICTAL) 25 MG tablet Take 25 mg by mouth 2 (two) times daily.    [provider]  meloxicam (MOBIC) 15 MG tablet Take one pill a day with food for 7 days then as needed 10/31/15   Ralene Corkraper, Timothy R, DO    Family History No family history on file.  Social History Social History   Tobacco Use  . Smoking status: Never Smoker  . Smokeless tobacco:  Never Used  Substance Use Topics  . Alcohol use: No  . Drug use: No     Allergies   Patient has no known allergies.   Review of Systems Review of Systems  All other systems reviewed and are negative.    Physical Exam Updated Vital Signs BP 131/90 (BP Location: Right Arm)   Pulse 79   Temp 98.2 F (36.8 C) (Oral)   Resp 16   Ht 5\' 11"  (1.803 m)   Wt 68 kg (150 lb)   SpO2 99%   BMI 20.92 kg/m   Physical Exam  Constitutional: He is oriented to person, place, and time. He appears well-developed and well-nourished.  HENT:  Head: Normocephalic and atraumatic.  Eyes: Conjunctivae are normal. Pupils are equal, round, and reactive to light. Right eye exhibits no discharge. Left eye exhibits no discharge. No scleral icterus.  Neck: Normal range of motion. No JVD present. No tracheal deviation present.  Cardiovascular: Normal rate, regular rhythm and normal heart sounds.  Pulmonary/Chest: No stridor. No respiratory distress. He has no wheezes. He has no rales. He exhibits no tenderness.  Musculoskeletal: He exhibits no edema.  Neurological: He is alert and oriented to person, place, and time. Coordination normal.  Skin: Skin is warm. No rash noted.  Psychiatric: He has a normal mood and affect. His behavior is normal. Judgment and thought content normal.  Nursing note and vitals reviewed.    ED Treatments / Results  Labs (all labs ordered are listed, but only abnormal results are displayed) Labs Reviewed - No data to display  EKG  EKG Interpretation None       Radiology No results found.  Procedures Procedures (including critical care time)  Medications Ordered in ED Medications - No data to display   Initial Impression / Assessment and Plan / ED Course  I have reviewed the triage vital signs and the nursing notes.  Pertinent labs & imaging results that were available during my care of the patient were reviewed by me and considered in my medical decision  making (see chart for details).      Final Clinical Impressions(s) / ED Diagnoses   Final diagnoses:  Shortness of breath    21 year old male presents today with his breath likely secondary to panic attack [name].  Patient with dramatic improvement in symptoms.  Reassuring evaluation here no signs of PE, ACS or any other acute abnormality.  Patient encouraged to discontinue using medication until speaking with psychiatry.  Patient is given strict return precautions, he verbalized understanding and agreement to today's plan and had no further questions or concerns at the time of discharge.  ED Discharge Orders    None       Rosalio LoudHedges, Laressa Bolinger, PA-C 01/23/17 2139    Lavera GuiseLiu, Dana Duo, MD 01/23/17 2325

## 2017-03-11 ENCOUNTER — Emergency Department (HOSPITAL_COMMUNITY)
Admission: EM | Admit: 2017-03-11 | Discharge: 2017-03-11 | Disposition: A | Discharge status: Home / Self Care | Payer: Self-pay | Attending: Emergency Medicine | Admitting: Emergency Medicine

## 2017-03-11 ENCOUNTER — Encounter (HOSPITAL_COMMUNITY): Payer: Self-pay | Admitting: Emergency Medicine

## 2017-03-11 DIAGNOSIS — H5711 Ocular pain, right eye: Secondary | ICD-10-CM

## 2017-03-11 DIAGNOSIS — H11431 Conjunctival hyperemia, right eye: Secondary | ICD-10-CM | POA: Insufficient documentation

## 2017-03-11 DIAGNOSIS — H1589 Other disorders of sclera: Secondary | ICD-10-CM

## 2017-03-11 MED ORDER — TETRACAINE HCL 0.5 % OP SOLN
2.0000 [drp] | Freq: Once | OPHTHALMIC | Status: AC
  Administered 2017-03-11: 2 [drp] via OPHTHALMIC
  Filled 2017-03-11: qty 4

## 2017-03-11 MED ORDER — FLUORESCEIN SODIUM 1 MG OP STRP
1.0000 | ORAL_STRIP | Freq: Once | OPHTHALMIC | Status: AC
  Administered 2017-03-11: 1 via OPHTHALMIC
  Filled 2017-03-11: qty 1

## 2017-03-11 NOTE — ED Provider Notes (Signed)
[facility]-EMERGENCY DEPT Provider Note   CSN: [id number] Arrival date & time: 03/11/17  1638     History   Chief Complaint Chief Complaint  Patient presents with  . Eye Pain    HPI   Blood pressure (!) 136/93, pulse 87, resp. rate 18, height 5\' 9"  (1.753 m), weight 76.3 kg (168 lb 4.8 oz), SpO2 96 %.  Brett Meza is a 22 y.o. male complaining of bilateral eye redness and irritation worsening over the course of the last 5 days.  He states that the pain has become more significant in the last 24 hours.  He has mild associated blurred vision.  He does not wear contact lenses.  He was seen by outside provider and diagnosed with a allergic eye inflammation.  With the lack of resolution of symptoms he was advised to come to the ED for further analysis.  On review of systems patient notes a mild worsening of pain with eye movement.   Past Medical History:  Diagnosis Date  . Depression   . Schizophrenia, childhood     Patient Active Problem List   Diagnosis Date Noted  . Knee injury, left, initial encounter 02/27/2016  . Major depressive disorder, recurrent, severe without psychotic features (HCC) 03/22/2015    Past Surgical History:  Procedure Laterality Date  . ANTERIOR CRUCIATE LIGAMENT REPAIR         Home Medications    Prior to Admission medications   Medication Sig Start Date End Date Taking? Authorizing Provider  ibuprofen (ADVIL,MOTRIN) 200 MG tablet Take 200 mg by mouth daily as needed for moderate pain.   Yes [provider]  loratadine (CLARITIN) 10 MG tablet Take 10 mg by mouth daily.   Yes [provider]  meloxicam (MOBIC) 15 MG tablet Take one pill a day with food for 7 days then as needed Patient not taking: Reported on 03/11/2017 10/31/15   Ralene Cork, DO    Family History No family history on file.  Social History Social History   Tobacco Use  . Smoking status: Never Smoker  . Smokeless tobacco: Never  Used  Substance Use Topics  . Alcohol use: No  . Drug use: No     Allergies   Lamotrigine   Review of Systems Review of Systems  A complete review of systems was obtained and all systems are negative except as noted in the HPI and PMH.   Physical Exam Updated Vital Signs BP 119/65 (BP Location: Left Arm)   Pulse 82   Temp 98.1 F (36.7 C) (Oral)   Resp 20   Ht 5\' 9"  (1.753 m)   Wt 76.3 kg (168 lb 4.8 oz)   SpO2 99%   BMI 24.85 kg/m   Physical Exam  Constitutional: He is oriented to person, place, and time. He appears well-developed and well-nourished. No distress.  HENT:  Head: Normocephalic.  Eyes: EOM are normal. Pupils are equal, round, and reactive to light. Right eye exhibits no discharge. Left eye exhibits no discharge. No scleral icterus.  Bilateral eye injection worse on the right than the left.  Pupils equal and reactive to light bilaterally.  Patient has positive consensual photophobia.  No proptosis, chemosis, lid swelling, discharge.  OD 95% CI OS 95% CI  No abnormal uptake fluorescien, no abnormality on anterior chamber exam with slit lamp.  Cardiovascular: Normal rate.  Pulmonary/Chest: Effort normal. No stridor.  Musculoskeletal: Normal range of motion.  Neurological: He is alert and  oriented to person, place, and time.  Psychiatric: He has a normal mood and affect.  Nursing note and vitals reviewed.    ED Treatments / Results  Labs (all labs ordered are listed, but only abnormal results are displayed) Labs Reviewed - No data to display  EKG  EKG Interpretation None       Radiology No results found.  Procedures Procedures (including critical care time)  Medications Ordered in ED Medications  tetracaine (PONTOCAINE) 0.5 % ophthalmic solution 2 drop (2 drops Right Eye Given by Other 03/11/17 1840)  fluorescein ophthalmic strip 1 strip (1 strip Right Eye Given 03/11/17 1840)  fluorescein ophthalmic strip 1 strip (1 strip  Both Eyes Given 03/11/17 1840)     Initial Impression / Assessment and Plan / ED Course  I have reviewed the triage vital signs and the nursing notes.  Pertinent labs & imaging results that were available during my care of the patient were reviewed by me and considered in my medical decision making (see chart for details).     Vitals:   03/11/17 1642 03/11/17 2039  BP: (!) 136/93 119/65  Pulse: 87 82  Resp: 18 20  Temp:  98.1 F (36.7 C)  TempSrc: Oral Oral  SpO2: 96% 99%  Weight: 76.3 kg (168 lb 4.8 oz)   Height: 5\' 9"  (1.753 m)     Medications  tetracaine (PONTOCAINE) 0.5 % ophthalmic solution 2 drop (2 drops Right Eye Given by Other 03/11/17 1840)  fluorescein ophthalmic strip 1 strip (1 strip Right Eye Given 03/11/17 1840)  fluorescein ophthalmic strip 1 strip (1 strip Both Eyes Given 03/11/17 1840)    Brett Meza is 22 y.o. male presenting with bilateral eye injection and eye pain worse on the right than the left worsening over the course of the last several days.  No discharge.  Physical exam more consistent with a uveitis given his consensual photophobia, however no cells and flare appreciated on anterior chamber exam.  Normal intraocular pressure.  Normal visual acuity, patient afebrile and nontoxic-appearing, I doubt this is an orbital cellulitis.  Ophthalmology consult from Dr. Alden HippGrote appreciated: Given the findings we have observed tonight he agrees not to pursue emergent workup of a orbital cellulitis.  Recommends not starting steroids as he will be seen first thing in the morning at the office tomorrow.  Discussed with patient who is amenable to plan.  Evaluation does not show pathology that would require ongoing emergent intervention or inpatient treatment. Pt is hemodynamically stable and mentating appropriately. Discussed findings and plan with patient/guardian, who agrees with care plan. All questions answered. Return precautions discussed and outpatient follow up given.       Final Clinical Impressions(s) / ED Diagnoses   Final diagnoses:  Acute right eye pain  Injection of surface of right eye    ED Discharge Orders    None       Cordarrell Sane, Mardella Laymanicole, PA-C 03/11/17 2156    Arby BarrettePfeiffer, Marcy, MD 03/17/17 1728

## 2017-03-11 NOTE — Discharge Instructions (Signed)
Please follow with your primary care doctor in the next 2 days for a check-up. They must obtain records for further management.  ° °Do not hesitate to return to the Emergency Department for any new, worsening or concerning symptoms.  ° °

## 2017-03-11 NOTE — ED Triage Notes (Signed)
Patient c/o bilat eye pain for week with right side more painful than other. [facility]

## 2019-04-02 ENCOUNTER — Other Ambulatory Visit: Payer: Self-pay

## 2019-04-02 DIAGNOSIS — Z20822 Contact with and (suspected) exposure to covid-19: Secondary | ICD-10-CM

## 2019-04-03 LAB — NOVEL CORONAVIRUS, NAA: SARS-CoV-2, NAA: NOT DETECTED

## 2019-08-31 ENCOUNTER — Encounter (HOSPITAL_COMMUNITY): Payer: Self-pay | Admitting: Emergency Medicine

## 2019-08-31 ENCOUNTER — Emergency Department (HOSPITAL_COMMUNITY): Payer: BC Managed Care – PPO

## 2019-08-31 ENCOUNTER — Emergency Department (HOSPITAL_COMMUNITY)
Admission: EM | Admit: 2019-08-31 | Discharge: 2019-08-31 | Disposition: A | Discharge status: Home / Self Care | Payer: BC Managed Care – PPO | Attending: Emergency Medicine | Admitting: Emergency Medicine

## 2019-08-31 ENCOUNTER — Other Ambulatory Visit: Payer: Self-pay

## 2019-08-31 DIAGNOSIS — M25562 Pain in left knee: Secondary | ICD-10-CM | POA: Insufficient documentation

## 2019-08-31 NOTE — ED Triage Notes (Signed)
Pt c/o left knee pain that started Friday after falling from his [employer].

## 2019-09-01 NOTE — ED Provider Notes (Signed)
[facility] EMERGENCY DEPARTMENT Provider Note   CSN: [id number] Arrival date & time: 08/31/19  1926     History Chief Complaint  Patient presents with  . Knee Pain    Brett Meza is a 24 y.o. male.  HPI   24 year old male with left knee pain and swelling.  He injured this knee Friday while skateboarding.  He has had persistent pain and mild swelling since that time.  He can bear weight although with some increased pain.  Is been wearing a knee sleeve which helps his symptoms.  Denies any other acute injuries.  Past Medical History:  Diagnosis Date  . Depression   . Schizophrenia, childhood [facility])     Patient Active Problem List   Diagnosis Date Noted  . Knee injury, left, initial encounter 02/27/2016  . Major depressive disorder, recurrent, severe without psychotic features (HCC) 03/22/2015    Past Surgical History:  Procedure Laterality Date  . ANTERIOR CRUCIATE LIGAMENT REPAIR         No family history on file.  Social History   Tobacco Use  . Smoking status: Never Smoker  . Smokeless tobacco: Never Used  Vaping Use  . Vaping Use: Never used  Substance Use Topics  . Alcohol use: No  . Drug use: No    Home Medications Prior to Admission medications   Medication Sig Start Date End Date Taking? Authorizing Provider  ibuprofen (ADVIL,MOTRIN) 200 MG tablet Take 200 mg by mouth daily as needed for moderate pain.    [provider]  loratadine (CLARITIN) 10 MG tablet Take 10 mg by mouth daily.    [provider]  meloxicam (MOBIC) 15 MG tablet Take one pill a day with food for 7 days then as needed Patient not taking: Reported on 03/11/2017 10/31/15   Ralene Cork, DO    Allergies    Lamotrigine  Review of Systems   Review of Systems All systems reviewed and negative, other than as noted in HPI.  Physical Exam Updated Vital Signs BP (!) 114/64 (BP Location: Left Arm)   Pulse 70   Temp 98.6 F (37 C) (Oral)    Resp 16   SpO2 100%   Physical Exam Vitals and nursing note reviewed.  Constitutional:      General: He is not in acute distress.    Appearance: He is well-developed.  HENT:     Head: Normocephalic and atraumatic.  Eyes:     General:        Right eye: No discharge.        Left eye: No discharge.     Conjunctiva/sclera: Conjunctivae normal.  Cardiovascular:     Rate and Rhythm: Normal rate and regular rhythm.     Heart sounds: Normal heart sounds. No murmur heard.  No friction rub. No gallop.   Pulmonary:     Effort: Pulmonary effort is normal. No respiratory distress.     Breath sounds: Normal breath sounds.  Abdominal:     General: There is no distension.     Palpations: Abdomen is soft.     Tenderness: There is no abdominal tenderness.  Musculoskeletal:        General: Swelling and tenderness present.     Cervical back: Neck supple.     Comments: Left knee with mild swelling.  Effusion.  Tenderness to palpation along the medial aspect.  Questional positive anterior drawer.  Neurovascular intact distally.  Skin:    General: Skin is warm and  dry.  Neurological:     Mental Status: He is alert.  Psychiatric:        Behavior: Behavior normal.        Thought Content: Thought content normal.     ED Results / Procedures / Treatments   Labs (all labs ordered are listed, but only abnormal results are displayed) Labs Reviewed - No data to display  EKG None  Radiology DG Knee Complete 4 Views Left  Result Date: 08/31/2019 CLINICAL DATA:  24 year old male with fall and left knee pain. EXAM: LEFT KNEE - COMPLETE 4+ VIEW COMPARISON:  None. FINDINGS: There is no acute fracture or dislocation. The bones are well mineralized. No arthritic changes. Postsurgical changes of ACL repair. There is a small suprapatellar effusion. The soft tissues are unremarkable. IMPRESSION: 1. No acute fracture or dislocation. 2. Small suprapatellar effusion. Electronically Signed   By: Elgie Collard  M.D.   On: 08/31/2019 20:19    Procedures Procedures (including critical care time)  Medications Ordered in ED Medications - No data to display  ED Course  I have reviewed the triage vital signs and the nursing notes.  Pertinent labs & imaging results that were available during my care of the patient were reviewed by me and considered in my medical decision making (see chart for details).    MDM Rules/Calculators/A&P                         24 year old male with acute left knee pain.  Imaging negative for acute osseous abnormality.  Question possible ligamentous injury.  Crutches.  Rice therapy.  Walking is fine but advised no strenuous activity till he can follow-up with orthopedics or sports medicine.  Final Clinical Impression(s) / ED Diagnoses Final diagnoses:  Acute pain of left knee    Rx / DC Orders ED Discharge Orders    None       Raeford Razor, MD 09/01/19 [phone]

## 2021-01-30 IMAGING — DX DG KNEE COMPLETE 4+V*L*
4 series · 4 of 4 positions shown · non-contrast
Comparison: None.

CLINICAL DATA: 23-year-old male with fall and left knee pain.

EXAM:
LEFT KNEE - COMPLETE 4+ VIEW

[knee ap]
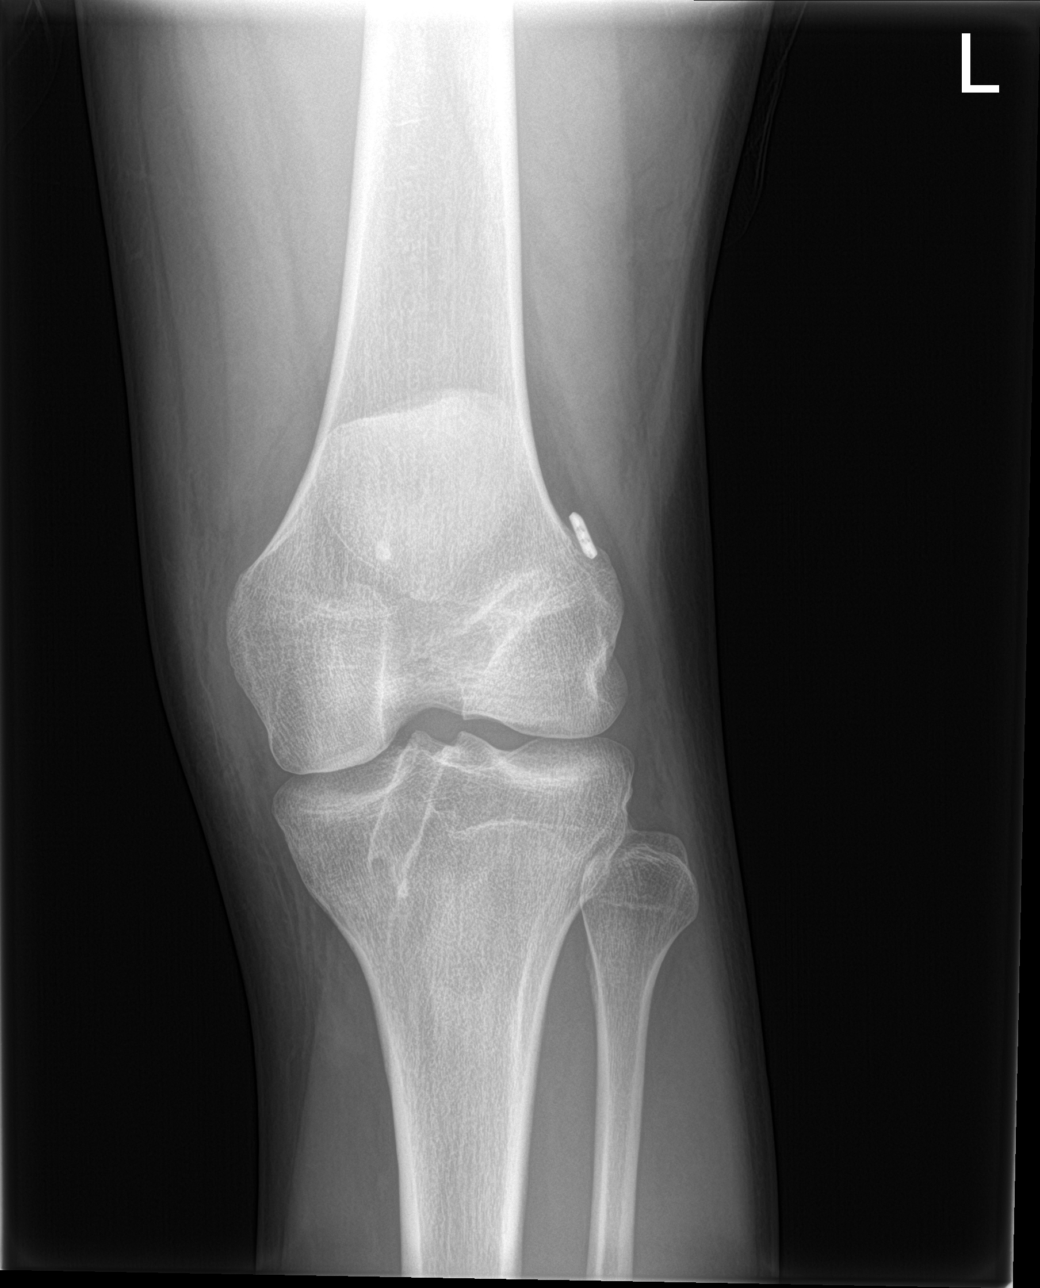

[knee lat]
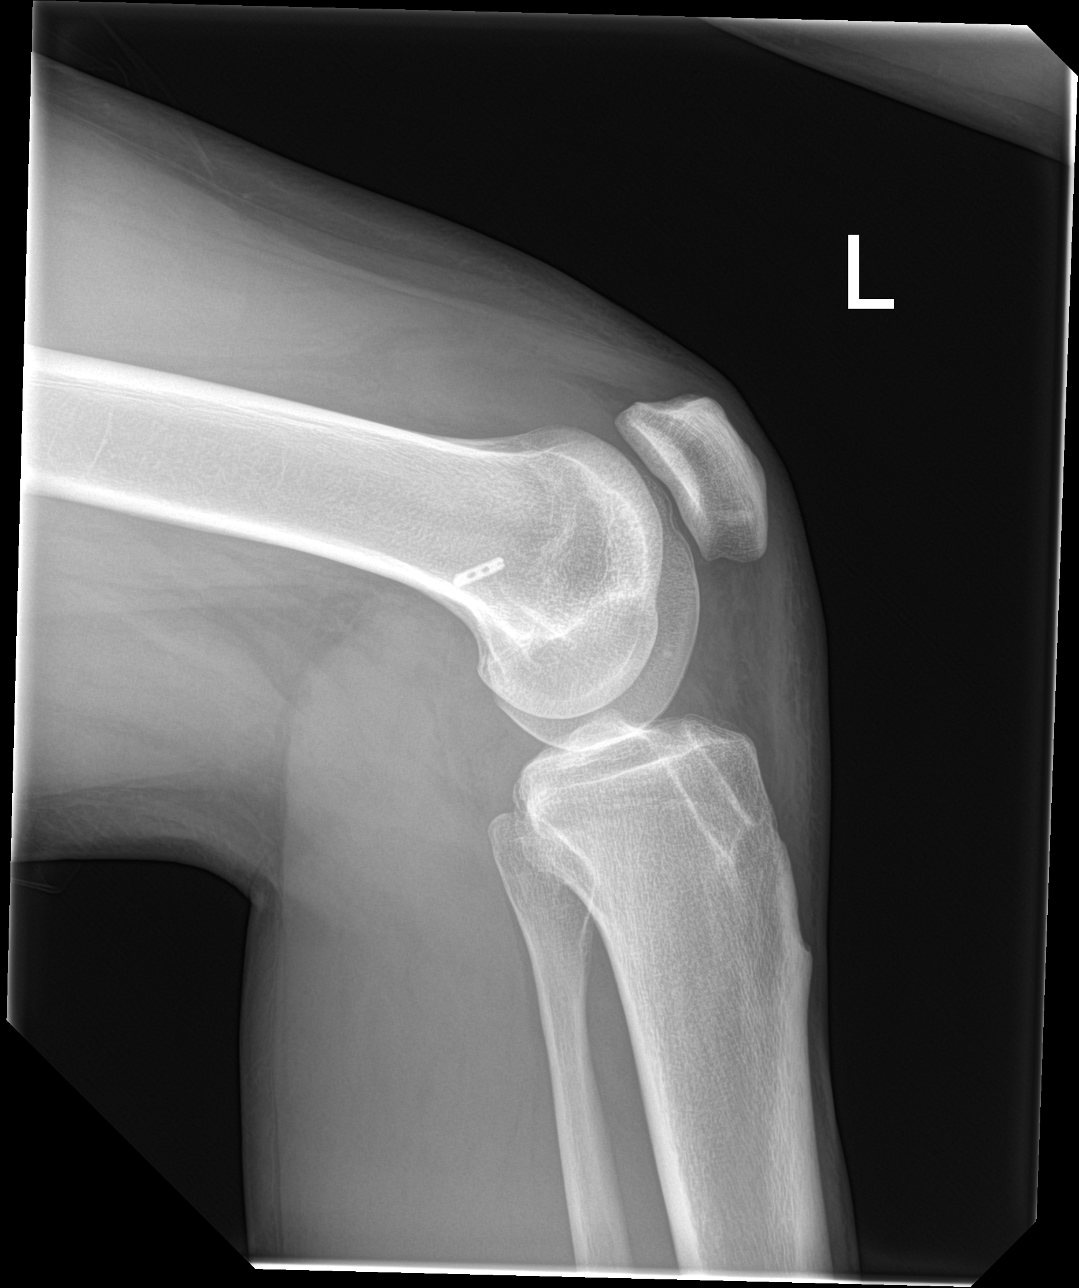

[knee obl (1 of 2)]
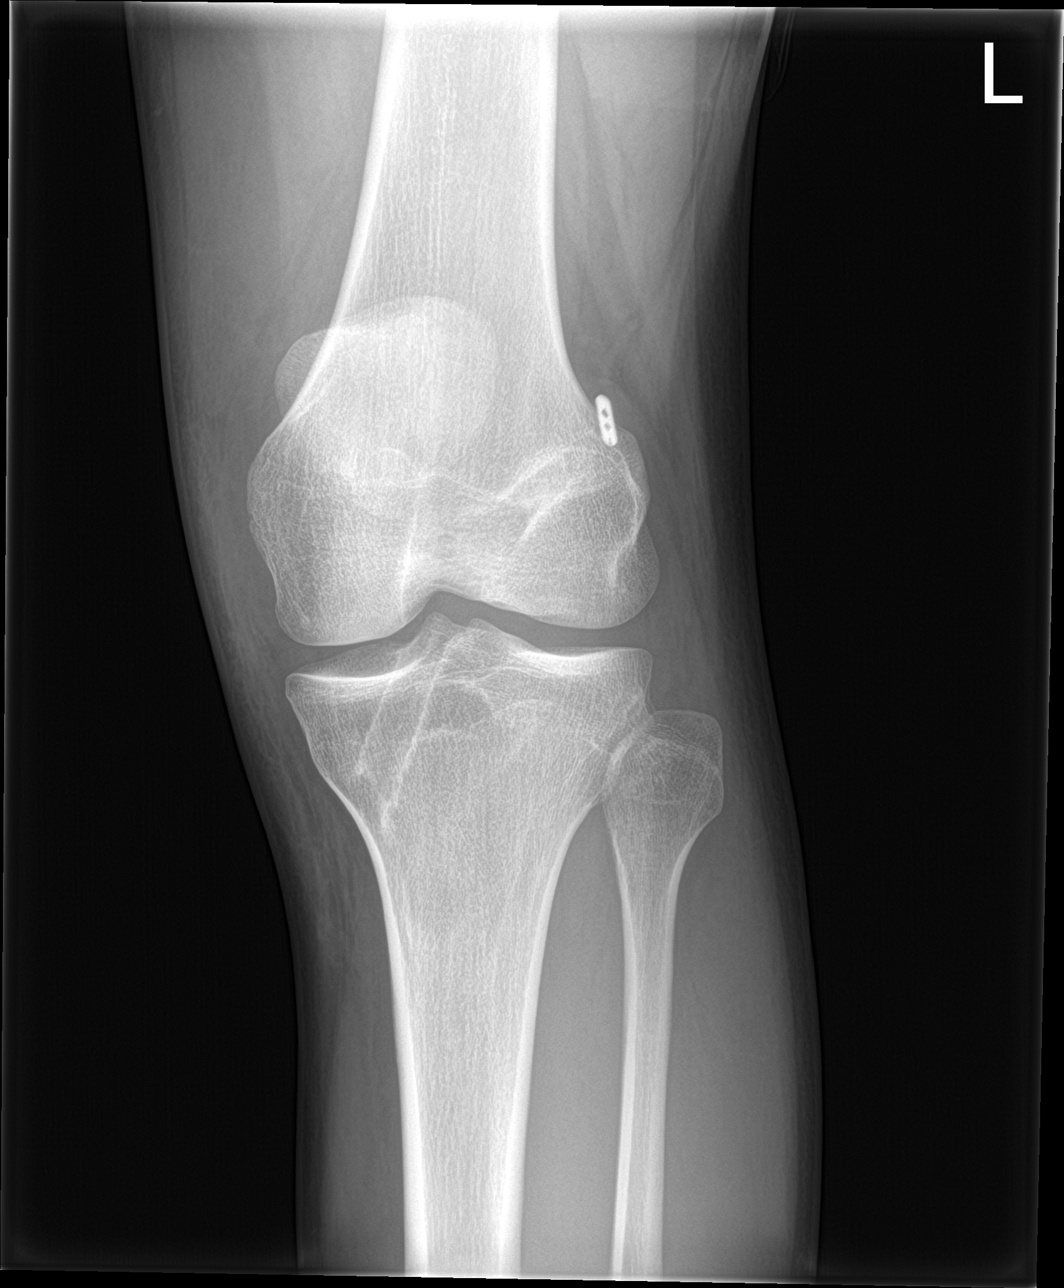

[knee obl (2 of 2)]
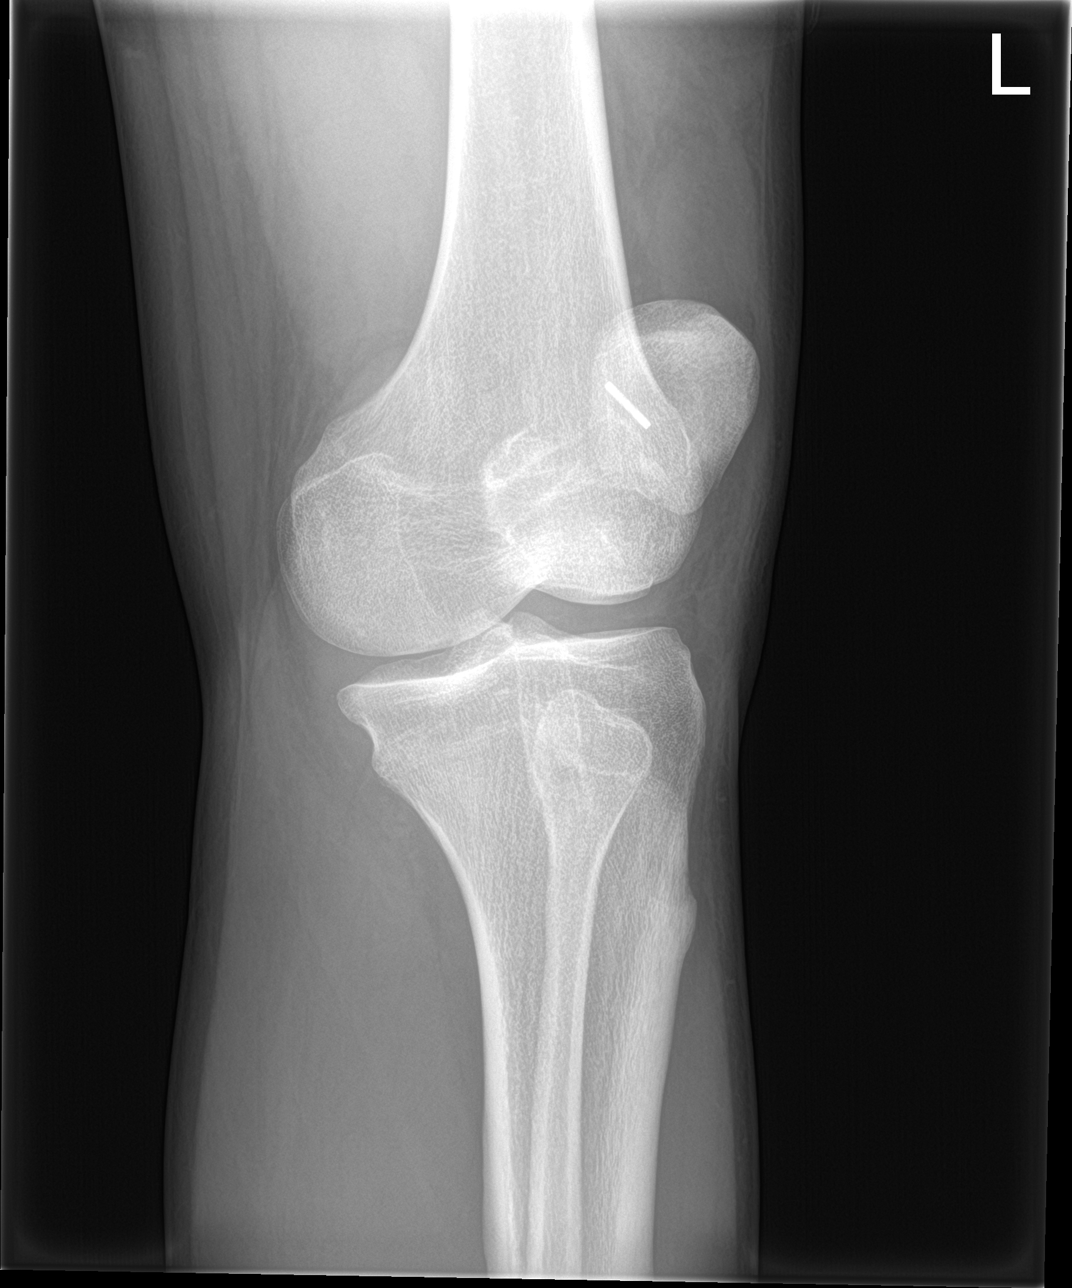

[4 of 4 positions shown; findings below may reference images not displayed]

FINDINGS: There is no acute fracture or dislocation. The bones are well
mineralized. No arthritic changes. Postsurgical changes of ACL
repair. There is a small suprapatellar effusion. The soft tissues
are unremarkable.
IMPRESSION: 1. No acute fracture or dislocation.
2. Small suprapatellar effusion.
